# Patient Record
Sex: Male | Born: 1990 | Race: White | Hispanic: No | Marital: Single | Smoking: Never smoker
Health system: Southern US, Community
[De-identification: ages and names within clinical notes are randomized; demographics above are authoritative.]

---

## 2012-09-24 HISTORY — PX: ANKLE SURGERY: SHX546

## 2020-07-27 ENCOUNTER — Encounter (HOSPITAL_COMMUNITY): Payer: Self-pay | Admitting: Emergency Medicine

## 2020-07-27 ENCOUNTER — Emergency Department (HOSPITAL_COMMUNITY)
Admission: EM | Admit: 2020-07-27 | Discharge: 2020-07-27 | Disposition: A | Discharge status: Home / Self Care | Payer: Medicaid Other | Attending: Emergency Medicine | Admitting: Emergency Medicine

## 2020-07-27 ENCOUNTER — Other Ambulatory Visit: Payer: Self-pay

## 2020-07-27 DIAGNOSIS — X500XXA Overexertion from strenuous movement or load, initial encounter: Secondary | ICD-10-CM | POA: Diagnosis not present

## 2020-07-27 DIAGNOSIS — M5440 Lumbago with sciatica, unspecified side: Secondary | ICD-10-CM | POA: Insufficient documentation

## 2020-07-27 DIAGNOSIS — Y93F2 Activity, caregiving, lifting: Secondary | ICD-10-CM | POA: Diagnosis not present

## 2020-07-27 DIAGNOSIS — M545 Low back pain, unspecified: Secondary | ICD-10-CM | POA: Diagnosis present

## 2020-07-27 MED ORDER — PREDNISONE 10 MG PO TABS: 20.0000 mg | ORAL_TABLET | Freq: Every day | ORAL | 0 refills | Status: DC

## 2020-07-27 MED ORDER — METHYLPREDNISOLONE SODIUM SUCC 125 MG IJ SOLR
125.0000 mg | Freq: Once | INTRAMUSCULAR | Status: AC
  Administered 2020-07-27: 125 mg via INTRAMUSCULAR
  Filled 2020-07-27: qty 2

## 2020-07-27 NOTE — Discharge Instructions (Signed)
Follow-up with Dr. Romeo Apple next week.  Make sure you take the tramadol 4 times a day for discomfort.

## 2020-07-27 NOTE — ED Provider Notes (Signed)
[facility] EMERGENCY DEPARTMENT Provider Note   CSN: [id number] Arrival date & time: 07/27/20  1653     History Chief Complaint  Patient presents with  . Back Pain    Nathaniel Guerrero is a 29 y.o. male.  Patient complains of lower back pain with pain radiating down both legs.  Patient recently was doing heavy lifting and had back pain.  He was seen in another hospital 2 days ago and had x-rays that showed degenerative changes and was started on tramadol.  Patient has only taken tramadol twice a day for the discomfort.  The history is provided by the patient and medical records. No language interpreter was used.  Back Pain Location:  Lumbar spine Quality:  Aching Radiates to: Both legs. Pain severity:  Moderate Pain is:  Worse during the day Onset quality:  Gradual Timing:  Constant Progression:  Worsening Chronicity:  New Context: not emotional stress   Associated symptoms: no abdominal pain, no chest pain and no headaches        History reviewed. No pertinent past medical history.  There are no problems to display for this patient.   Past Surgical History:  Procedure Laterality Date  . ANKLE SURGERY Right 2014       History reviewed. No pertinent family history.  Social History   Tobacco Use  . Smoking status: Never Smoker  . Smokeless tobacco: Never Used  Substance Use Topics  . Alcohol use: Never  . Drug use: Never    Home Medications Prior to Admission medications   Medication Sig Start Date End Date Taking? Authorizing Provider  predniSONE (DELTASONE) 10 MG tablet Take 2 tablets (20 mg total) by mouth daily. 07/27/20   Bethann Berkshire, MD    Allergies    Toradol [ketorolac tromethamine]  Review of Systems   Review of Systems  Constitutional: Negative for appetite change and fatigue.  HENT: Negative for congestion, ear discharge and sinus pressure.   Eyes: Negative for discharge.  Respiratory: Negative for cough.   Cardiovascular: Negative  for chest pain.  Gastrointestinal: Negative for abdominal pain and diarrhea.  Genitourinary: Negative for frequency and hematuria.  Musculoskeletal: Positive for back pain.  Skin: Negative for rash.  Neurological: Negative for seizures and headaches.  Psychiatric/Behavioral: Negative for hallucinations.    Physical Exam Updated Vital Signs BP 130/83 (BP Location: Right Arm)   Pulse 80   Temp 98.8 F (37.1 C) (Oral)   Resp 18   Ht 6\' 3"  (1.905 m)   Wt 102.1 kg   SpO2 97%   BMI 28.12 kg/m   Physical Exam Vitals reviewed.  Constitutional:      Appearance: He is well-developed.  HENT:     Head: Normocephalic.     Nose: Nose normal.  Eyes:     General: No scleral icterus.    Conjunctiva/sclera: Conjunctivae normal.  Neck:     Thyroid: No thyromegaly.  Cardiovascular:     Rate and Rhythm: Normal rate and regular rhythm.     Heart sounds: No murmur heard.  No friction rub. No gallop.   Pulmonary:     Breath sounds: No stridor. No wheezing or rales.  Chest:     Chest wall: No tenderness.  Abdominal:     General: There is no distension.     Tenderness: There is no abdominal tenderness. There is no rebound.  Musculoskeletal:     Cervical back: Neck supple.     Comments: Tender lumbar spine with positive straight leg bilaterally  Lymphadenopathy:     Cervical: No cervical adenopathy.  Skin:    Findings: No erythema or rash.  Neurological:     Mental Status: He is alert and oriented to person, place, and time.     Motor: No abnormal muscle tone.     Coordination: Coordination normal.  Psychiatric:        Behavior: Behavior normal.     ED Results / Procedures / Treatments   Labs (all labs ordered are listed, but only abnormal results are displayed) Labs Reviewed - No data to display  EKG None  Radiology No results found.  Procedures Procedures (including critical care time)  Medications Ordered in ED Medications  methylPREDNISolone sodium succinate  (SOLU-MEDROL) 125 mg/2 mL injection 125 mg (has no administration in time range)    ED Course  I have reviewed the triage vital signs and the nursing notes.  Pertinent labs & imaging results that were available during my care of the patient were reviewed by me and considered in my medical decision making (see chart for details).    MDM Rules/Calculators/A&P                          Patient with lumbar back pain.  Degenerative changes seen on x-ray.  We will start the patient on prednisone and have him increase the tramadol to 4 times a day and he is referred to orthopedics     This patient presents to the ED for concern of back pain, this involves an extensive number of treatment options, and is a complaint that carries with it a high risk of complications and morbidity.  The differential diagnosis includes lumbar pain sciatica   Lab Tests:     Medicines ordered:   I ordered medication Solu-Medrol  Imaging Studies ordered:     Additional history obtained:     Consultations Obtained:     Reevaluation:  After the interventions stated above, I reevaluated the patient and found no change  Critical Interventions:  .   Final Clinical Impression(s) / ED Diagnoses Final diagnoses:  Acute left-sided low back pain with sciatica, sciatica laterality unspecified    Rx / DC Orders ED Discharge Orders         Ordered    predniSONE (DELTASONE) 10 MG tablet  Daily        07/27/20 1731           Bethann Berkshire, MD 07/27/20 1735

## 2020-07-27 NOTE — ED Triage Notes (Addendum)
Pt reports was seen for same on Monday at [facility]. Pt reports was diagnosed with degenerative disc disease. Pt reports was moving furniture this weekend and reports lower back pain ever since. Pt denies any gi/gu symptoms. Pt reports pain is radiating to BLE and is getting harder to bear weight/ambulate. Pt reports given tramadol for pain and reports temporarily relieves back pain.

## 2021-04-26 ENCOUNTER — Observation Stay (HOSPITAL_COMMUNITY)
Admission: EM | Admit: 2021-04-26 | Discharge: 2021-04-29 | Disposition: A | Discharge status: Home / Self Care | Payer: Medicaid Other | Attending: Internal Medicine | Admitting: Internal Medicine

## 2021-04-26 ENCOUNTER — Encounter (HOSPITAL_COMMUNITY): Payer: Self-pay | Admitting: *Deleted

## 2021-04-26 ENCOUNTER — Other Ambulatory Visit: Payer: Self-pay

## 2021-04-26 ENCOUNTER — Emergency Department (HOSPITAL_COMMUNITY): Payer: Medicaid Other

## 2021-04-26 DIAGNOSIS — R200 Anesthesia of skin: Secondary | ICD-10-CM | POA: Insufficient documentation

## 2021-04-26 DIAGNOSIS — R519 Headache, unspecified: Secondary | ICD-10-CM | POA: Diagnosis not present

## 2021-04-26 DIAGNOSIS — R479 Unspecified speech disturbances: Secondary | ICD-10-CM | POA: Diagnosis not present

## 2021-04-26 DIAGNOSIS — W08XXXA Fall from other furniture, initial encounter: Secondary | ICD-10-CM | POA: Diagnosis not present

## 2021-04-26 DIAGNOSIS — Y92009 Unspecified place in unspecified non-institutional (private) residence as the place of occurrence of the external cause: Secondary | ICD-10-CM | POA: Diagnosis not present

## 2021-04-26 DIAGNOSIS — Z20822 Contact with and (suspected) exposure to covid-19: Secondary | ICD-10-CM | POA: Insufficient documentation

## 2021-04-26 DIAGNOSIS — R531 Weakness: Principal | ICD-10-CM

## 2021-04-26 IMAGING — CT CT HEAD W/O CM
3 series · 16 of 47 positions shown, 19 images · non-contrast
Comparison: MRI brain [DATE] and prior head CT [DATE]

CLINICAL DATA: History of fall 2 nights ago.  Left-sided weakness.

EXAM:
CT HEAD WITHOUT CONTRAST
TECHNIQUE: Contiguous axial images were obtained from the base of the skull
through the vertex without intravenous contrast.

[Series 2: head w o · axial · 0.48mm/px · z∈[+16,+156]mm · 10 of 34 slices shown, 13 images]
[im 3/34  brain]
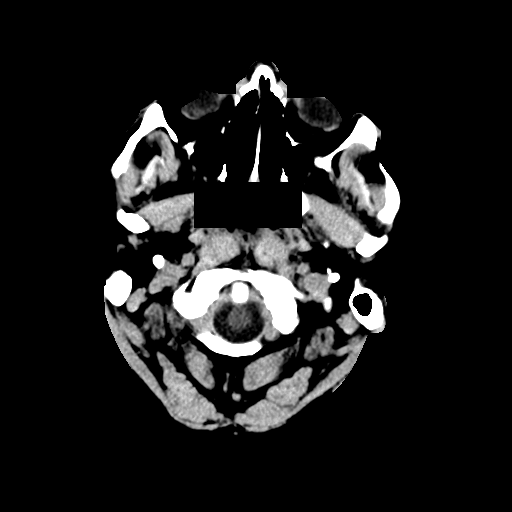
[im 3/34  bone]
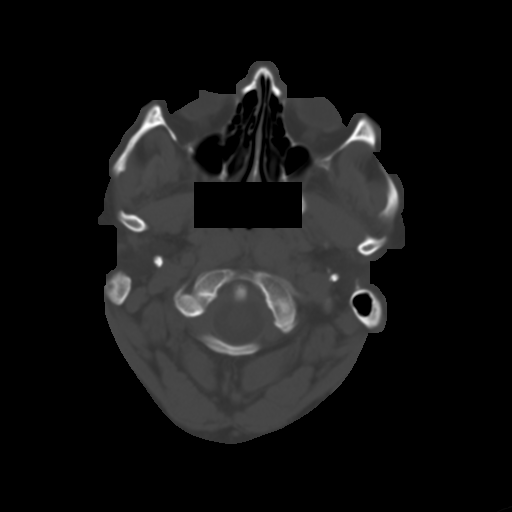
[im 6/34  brain]
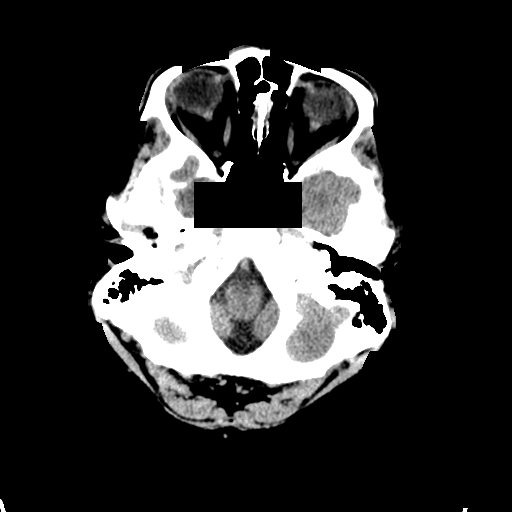
[im 10/34  brain]
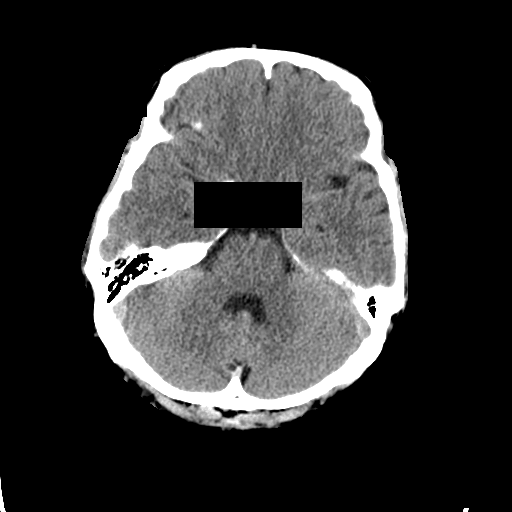
[im 12/34  brain]
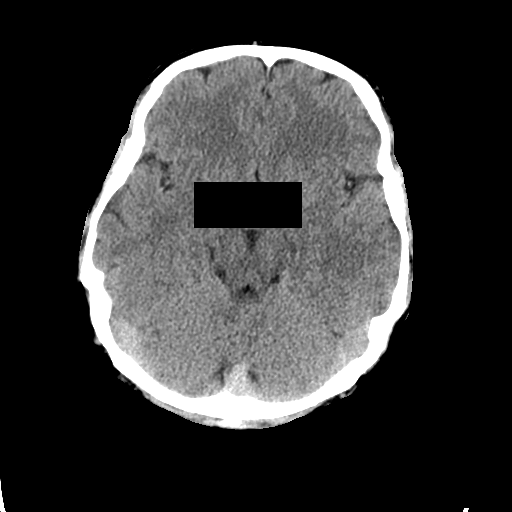
[im 15/34  brain]
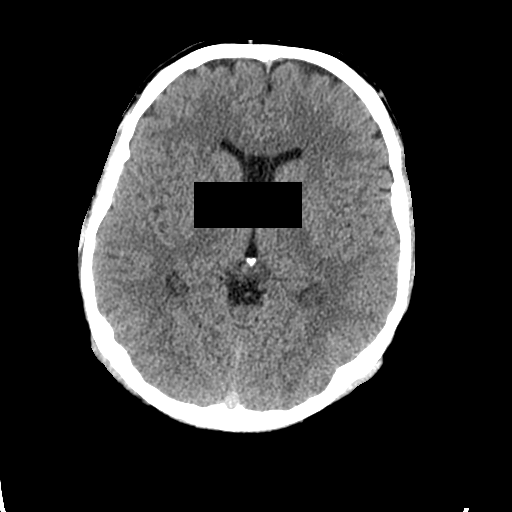
[im 15/34  bone]
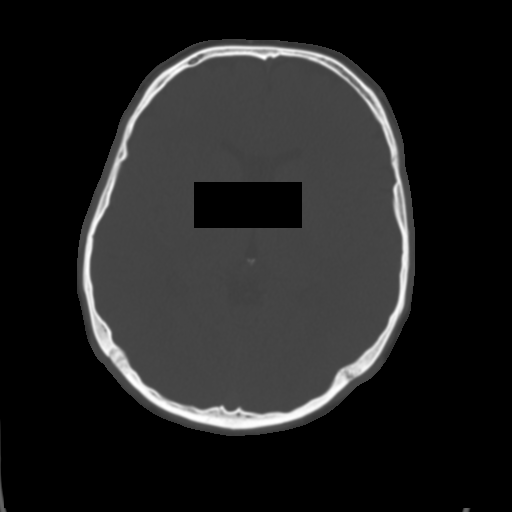
[im 19/34  brain]
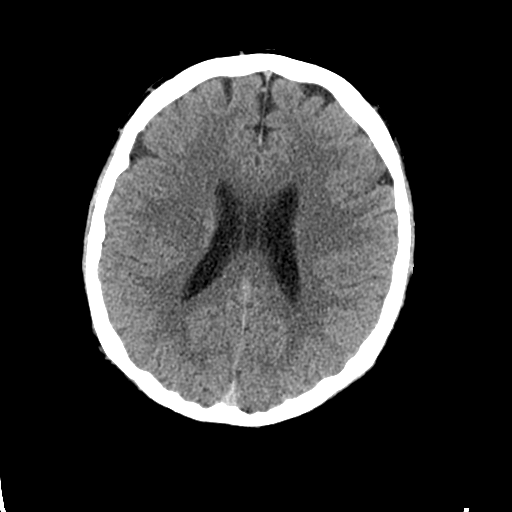
[im 22/34  brain]
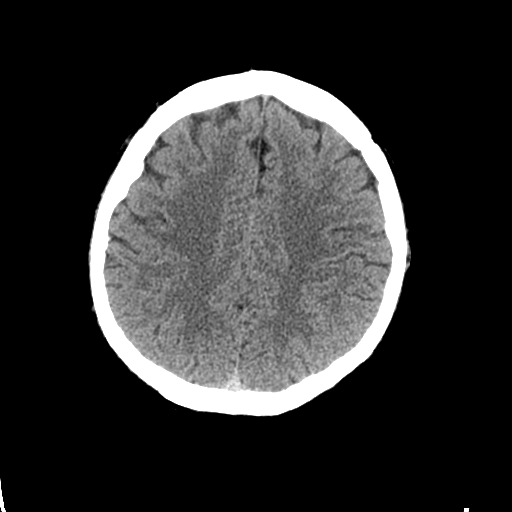
[im 26/34  brain]
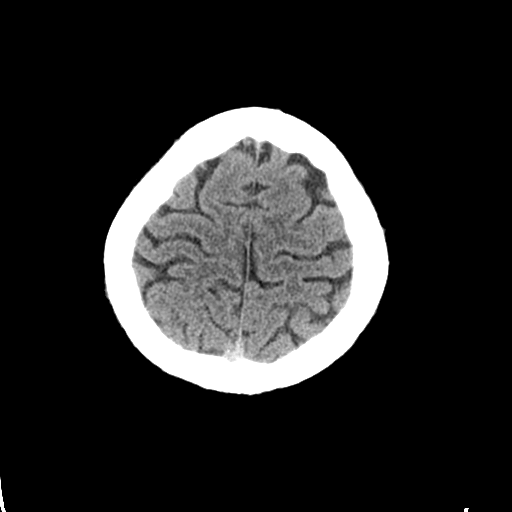
[im 28/34  brain]
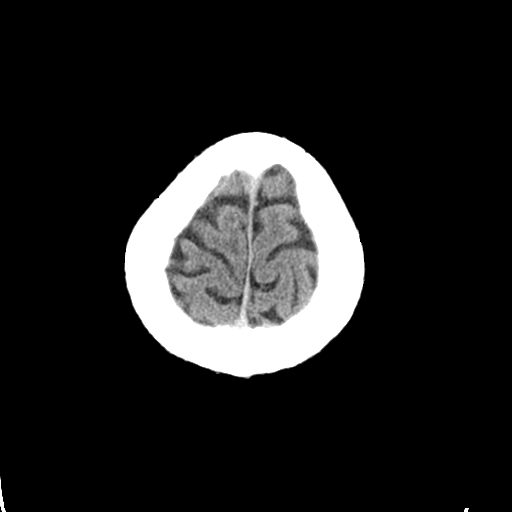
[im 28/34  bone]
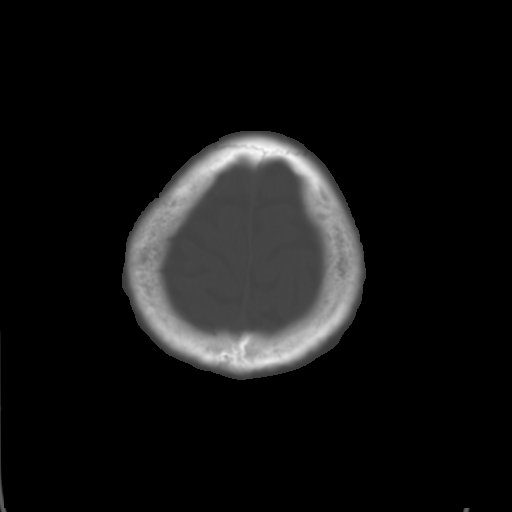
[im 31/34  brain]
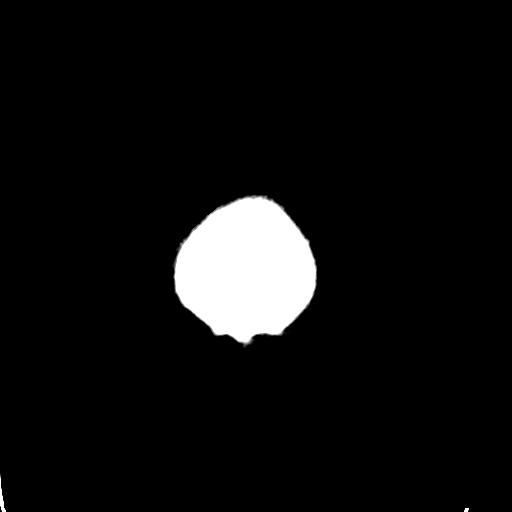

[Series 4: coronal soft · coronal · 0.35mm/px · 3 of 70 slices shown]
[im 24/70  brain]
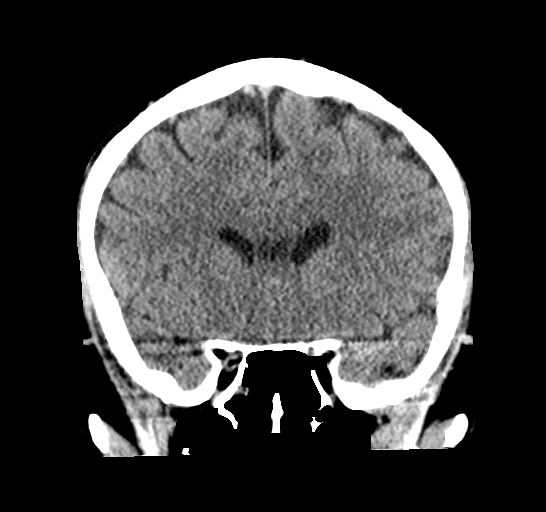
[im 31/70  brain]
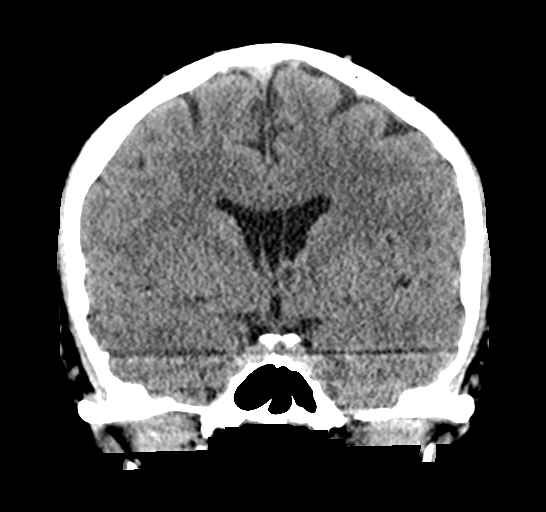
[im 39/70  brain]
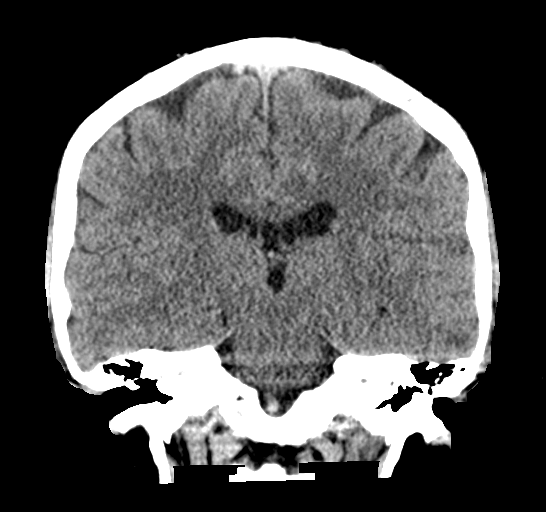

[Series 5: sagittal soft · sagittal · 0.39mm/px · 3 of 61 slices shown]
[im 21/61  brain]
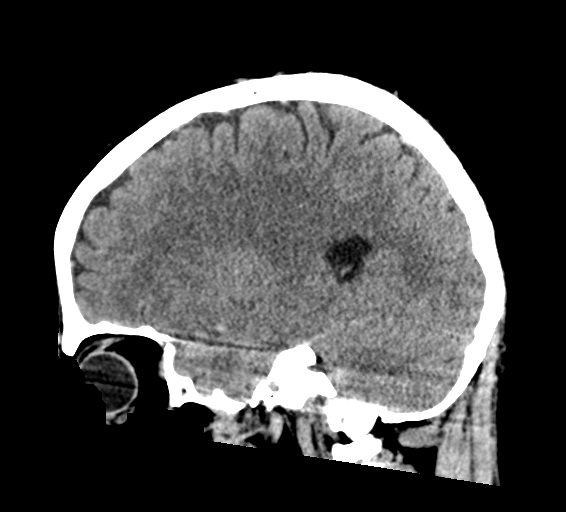
[im 31/61  brain]
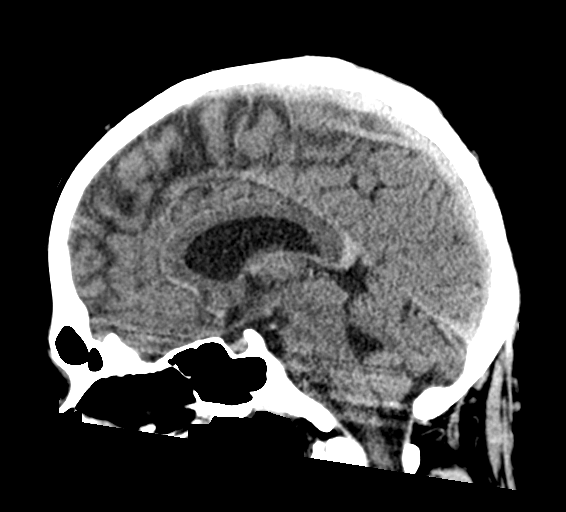
[im 41/61  brain]
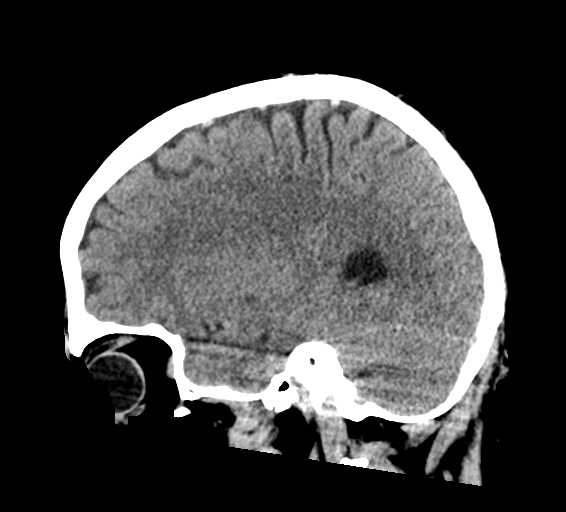

[16 of 47 positions shown; findings below may reference images not displayed]

FINDINGS: Brain: The ventricles are in the midline without mass effect or
shift. Incidental cavum septum pellucidum again noted. No
extra-axial fluid collections are identified. The gray-white
differentiation is maintained. No acute intracranial findings or
mass lesions. Brainstem and cerebellum are unremarkable.

Vascular: No vascular calcifications or hyperdense vessels.

Skull: No skull fracture or bone lesions.

Sinuses/Orbits: Paranasal sinuses and mastoid air cells are clear.
The globes are intact.

Other: No scalp lesions or scalp hematoma.
IMPRESSION: Normal and stable head CT.

## 2021-04-26 NOTE — ED Provider Notes (Signed)
[facility] EMERGENCY DEPARTMENT Provider Note   CSN: [id number] Arrival date & time: 04/26/21  1929     History Chief Complaint  Patient presents with   Extremity Weakness    Nathaniel Guerrero is a 30 y.o. male.  Patient is a 30 year old male presenting with complaints of weakness in his left arm and left leg, speech difficulty, and follow-up.  Patient initially seen at [facility] 3 days ago for similar issues.  Currently underwent CT of the head, CTA of the head, MRI of the brain, and neurology consult.  All of his studies were unremarkable.  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  Patient was discharged from there yesterday.  He presents here today complaining of his symptoms worsening and having difficulty with ambulation.  He states that he needs a cane to ambulate and his "speech is getting worse".  Upon reviewing his record.  Patient was seen 3 times at [facility] and a 2-day..  The first visit was related to ankle pain he has had since childhood.  The second visit was related to chest pain during which time he eloped prior to cycling of his cardiac enzymes.  The third visit was for the weakness of the left arm and leg.  The history is provided by the patient.  Extremity Weakness This is a new problem. The current episode started 2 days ago. The problem occurs constantly. Nothing aggravates the symptoms. Nothing relieves the symptoms. He has tried nothing for the symptoms.      History reviewed. No pertinent past medical history.  There are no problems to display for this patient.   Past Surgical History:  Procedure Laterality Date   ANKLE SURGERY Right 2014       History reviewed. No pertinent family history.  Social History   Tobacco Use   Smoking status: Never   Smokeless tobacco: Never  Substance Use Topics   Alcohol use: Never   Drug use: Never    Home Medications Prior to Admission medications   Medication Sig Start Date End Date Taking?  Authorizing Provider  predniSONE (DELTASONE) 10 MG tablet Take 2 tablets (20 mg total) by mouth daily. 07/27/20   Bethann Berkshire, MD    Allergies    Toradol [ketorolac tromethamine]  Review of Systems   Review of Systems  Musculoskeletal:  Positive for extremity weakness.  All other systems reviewed and are negative.  Physical Exam Updated Vital Signs BP 130/67   Pulse 81   Temp 98.7 F (37.1 C) (Oral)   Resp 20   Ht 6\' 3"  (1.905 m)   Wt 108.9 kg   SpO2 98%   BMI 30.00 kg/m   Physical Exam Vitals and nursing note reviewed.  Constitutional:      General: He is not in acute distress.    Appearance: He is well-developed. He is not diaphoretic.  HENT:     Head: Normocephalic and atraumatic.  Eyes:     Extraocular Movements: Extraocular movements intact.     Pupils: Pupils are equal, round, and reactive to light.  Cardiovascular:     Rate and Rhythm: Normal rate and regular rhythm.     Heart sounds: No murmur heard.   No friction rub.  Pulmonary:     Effort: Pulmonary effort is normal. No respiratory distress.     Breath sounds: Normal breath sounds. No wheezing or rales.  Abdominal:     General: Bowel sounds are normal. There is no distension.     Palpations:  Abdomen is soft.     Tenderness: There is no abdominal tenderness.  Musculoskeletal:        General: Normal range of motion.     Cervical back: Normal range of motion and neck supple.  Skin:    General: Skin is warm and dry.  Neurological:     Mental Status: He is alert and oriented to person, place, and time.     Coordination: Coordination normal.     Comments: Cranial nerves II through XII are grossly intact with no focal deficits.  His left hand is clenched and has difficulty both opening his hand and with hand grasp.  Plantar flexion and extension are 4 out of 5 in the left extremity and 5 out of 5 in the right.    ED Results / Procedures / Treatments   Labs (all labs ordered are listed, but only  abnormal results are displayed) Labs Reviewed - No data to display  EKG None  Radiology CT HEAD WO CONTRAST ( )  Result Date: 04/26/2021 CLINICAL DATA:  History of fall 2 nights ago.  Left-sided weakness. EXAM: CT HEAD WITHOUT CONTRAST TECHNIQUE: Contiguous axial images were obtained from the base of the skull through the vertex without intravenous contrast. COMPARISON:  MRI brain 04/26/2019 and prior head CT 04/24/2021 FINDINGS: Brain: The ventricles are in the midline without mass effect or shift. Incidental cavum septum pellucidum again noted. No extra-axial fluid collections are identified. The gray-white differentiation is maintained. No acute intracranial findings or mass lesions. Brainstem and cerebellum are unremarkable. Vascular: No vascular calcifications or hyperdense vessels. Skull: No skull fracture or bone lesions. Sinuses/Orbits: Paranasal sinuses and mastoid air cells are clear. The globes are intact. Other: No scalp lesions or scalp hematoma. IMPRESSION: Normal and stable head CT. Electronically Signed   By: Rudie Meyer M.D.   On: 04/26/2021 20:29    Procedures Procedures   Medications Ordered in ED Medications - No data to display  ED Course  I have reviewed the triage vital signs and the nursing notes.  Pertinent labs & imaging results that were available during my care of the patient were reviewed by me and considered in my medical decision making (see chart for details).    MDM Rules/Calculators/A&P  Patient presenting here with left-sided weakness and numbness as well as speech deficit as described in the HPI.  Patient previously worked up at [facility] with similar complaints.  Studies all unremarkable and patient was discharged.  He returns today stating that his symptoms are worse and believes that he is having a stroke.  His head CT is unremarkable and laboratory studies unremarkable.  I have spoken with teleneurology who has consulted and made recommendations  that patient be admitted for MRI with and without contrast and in person neurology consultation tomorrow.  I have spoken with Dr. Robb Matar who agrees to admit.  Final Clinical Impression(s) / ED Diagnoses Final diagnoses:  None    Rx / DC Orders ED Discharge Orders     None        Geoffery Lyons, MD 04/27/21 [phone]

## 2021-04-26 NOTE — ED Triage Notes (Signed)
Pt with fell Monday night due to sudden left sided weakness. Seen [facility] Monday night and stayed there over night and was released yesterday.  Pt states his speech has gotten worse.

## 2021-04-27 ENCOUNTER — Encounter (HOSPITAL_COMMUNITY): Payer: Self-pay | Admitting: Internal Medicine

## 2021-04-27 ENCOUNTER — Observation Stay (HOSPITAL_COMMUNITY): Payer: Medicaid Other

## 2021-04-27 DIAGNOSIS — R531 Weakness: Secondary | ICD-10-CM

## 2021-04-27 LAB — CBC WITH DIFFERENTIAL/PLATELET
Abs Immature Granulocytes: 0.02 10*3/uL (ref 0.00–0.07)
Basophils Absolute: 0 10*3/uL (ref 0.0–0.1)
Basophils Relative: 0 %
Eosinophils Absolute: 0 10*3/uL (ref 0.0–0.5)
Eosinophils Relative: 0 %
HCT: 50.2 % (ref 39.0–52.0)
Hemoglobin: 16.7 g/dL (ref 13.0–17.0)
Immature Granulocytes: 0 %
Lymphocytes Relative: 19 %
Lymphs Abs: 1.4 10*3/uL (ref 0.7–4.0)
MCH: 31.4 pg (ref 26.0–34.0)
MCHC: 33.3 g/dL (ref 30.0–36.0)
MCV: 94.4 fL (ref 80.0–100.0)
Monocytes Absolute: 0.5 10*3/uL (ref 0.1–1.0)
Monocytes Relative: 6 %
Neutro Abs: 5.3 10*3/uL (ref 1.7–7.7)
Neutrophils Relative %: 75 %
Platelets: 272 10*3/uL (ref 150–400)
RBC: 5.32 MIL/uL (ref 4.22–5.81)
RDW: 12.7 % (ref 11.5–15.5)
WBC: 7.1 10*3/uL (ref 4.0–10.5)
nRBC: 0 % (ref 0.0–0.2)

## 2021-04-27 LAB — RESP PANEL BY RT-PCR (FLU A&B, COVID) ARPGX2
Influenza A by PCR: NEGATIVE
Influenza B by PCR: NEGATIVE
SARS Coronavirus 2 by RT PCR: NEGATIVE

## 2021-04-27 LAB — VITAMIN B12: Vitamin B-12: 245 pg/mL (ref 180–914)

## 2021-04-27 LAB — BASIC METABOLIC PANEL
Anion gap: 8 (ref 5–15)
BUN: 17 mg/dL (ref 6–20)
CO2: 27 mmol/L (ref 22–32)
Calcium: 9.4 mg/dL (ref 8.9–10.3)
Chloride: 102 mmol/L (ref 98–111)
Creatinine, Ser: 1.01 mg/dL (ref 0.61–1.24)
GFR, Estimated: >60 mL/min (ref >60)
Glucose, Bld: 91 mg/dL (ref 70–99)
Potassium: 3.7 mmol/L (ref 3.5–5.1)
Sodium: 137 mmol/L (ref 135–145)

## 2021-04-27 LAB — TSH: TSH: 2.975 u[IU]/mL (ref 0.350–4.500)

## 2021-04-27 LAB — PROTIME-INR
INR: 1 (ref 0.8–1.2)
Prothrombin Time: 13.1 seconds (ref 11.4–15.2)

## 2021-04-27 IMAGING — MR MR HEAD WO/W CM
13 of 15 series · 36 of 48 positions shown · IV contrast (gadavist)
Comparison: MRI head [DATE]

CLINICAL DATA: Acute neuro deficit with left-sided weakness 3 days

EXAM:
MRI HEAD WITHOUT AND WITH CONTRAST
TECHNIQUE: Multiplanar, multiecho pulse sequences of the brain and surrounding
structures were obtained without and with intravenous contrast.
CONTRAST:  10mL GADAVIST GADOBUTROL 1 MMOL/ML IV SOLN

[Series 5: DWI · axial · 4.0mm · 0.88mm/px · z∈[-68,+72]mm · 4 of 36 slices shown (1 of 6)]
[im 1/36]
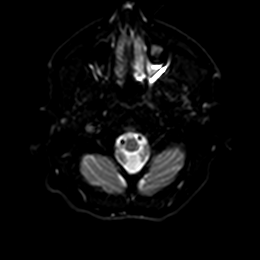
[im 12/36]
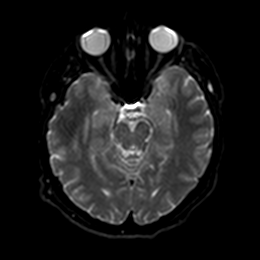
[im 24/36]
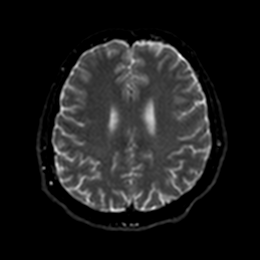
[im 36/36]
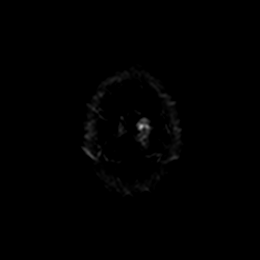

[Series 5: DWI · axial · 4.0mm · 0.88mm/px · z∈[-68,+72]mm · 3 of 36 slices shown (2 of 6)]
[im 1/36]
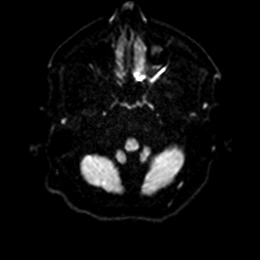
[im 18/36]
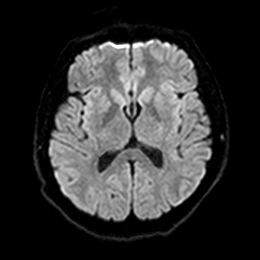
[im 36/36]
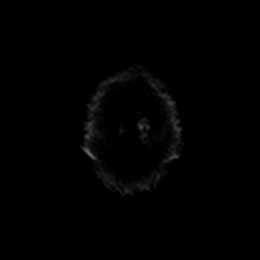

[Series 6: DWI · axial · 4.0mm · 0.88mm/px · z∈[-68,+72]mm · 3 of 36 slices shown (3 of 6)]
[im 1/36]
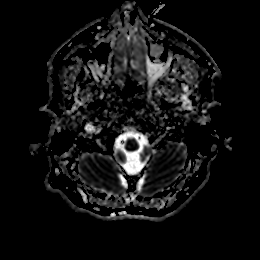
[im 18/36]
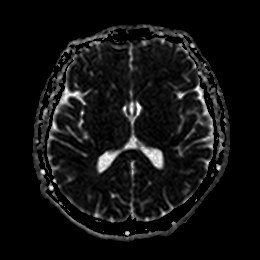
[im 36/36]
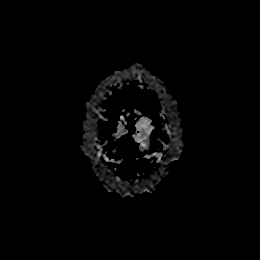

[Series 7: DWI · coronal · 5.0mm · 0.88mm/px · 3 of 28 slices shown (4 of 6)]
[im 1/28]
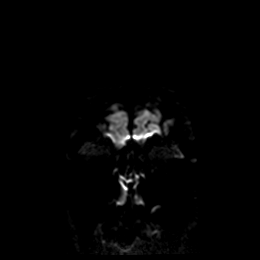
[im 14/28]
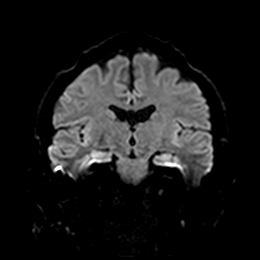
[im 28/28]
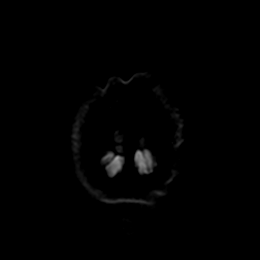

[Series 7: DWI · coronal · 5.0mm · 0.88mm/px · 3 of 28 slices shown (5 of 6)]
[im 1/28]
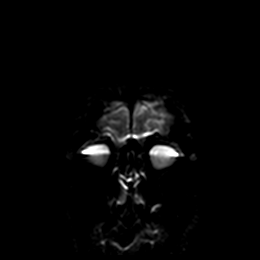
[im 14/28]
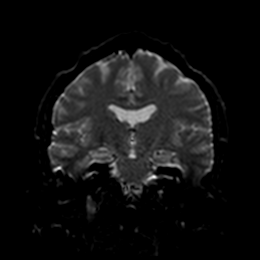
[im 28/28]
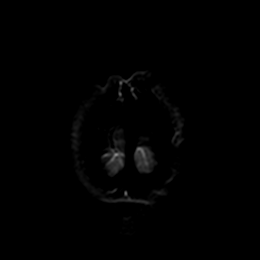

[Series 8: DWI · coronal · 5.0mm · 0.88mm/px · 3 of 28 slices shown (6 of 6)]
[im 1/28]
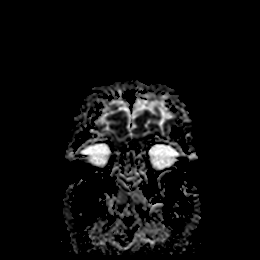
[im 14/28]
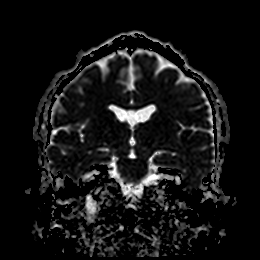
[im 28/28]
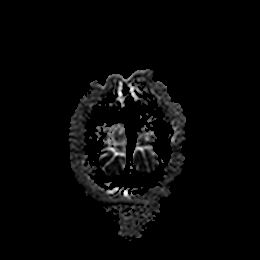

[Series 9: T1 · sagittal · 5.0mm · 0.94mm/px · 2 of 25 slices shown (1 of 2)]
[im 1/25]
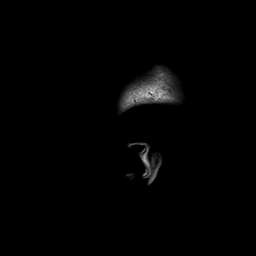
[im 25/25]
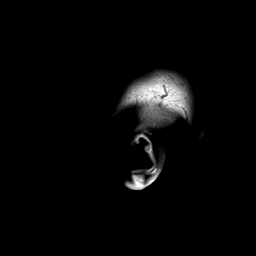

[Series 10: T2 · axial · 5.0mm · 0.72mm/px · z∈[-65,+68]mm · 2 of 20 slices shown (1 of 2)]
[im 1/20]
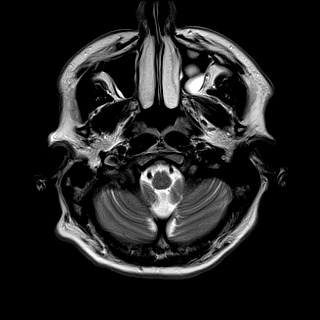
[im 20/20]
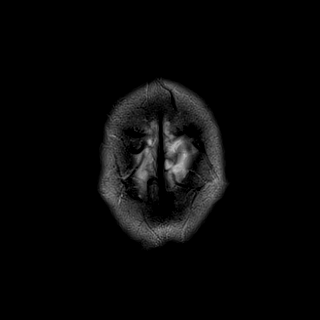

[Series 11: ax hemo · axial · 5.0mm · 0.86mm/px · z∈[-70,+74]mm · 2 of 25 slices shown]
[im 1/25]
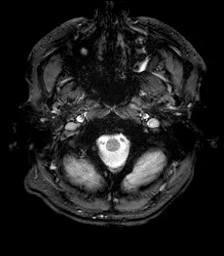
[im 25/25]
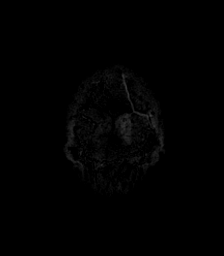

[Series 12: FLAIR · axial · 4.0mm · 0.43mm/px · z∈[-60,+64]mm · 3 of 32 slices shown]
[im 1/32]
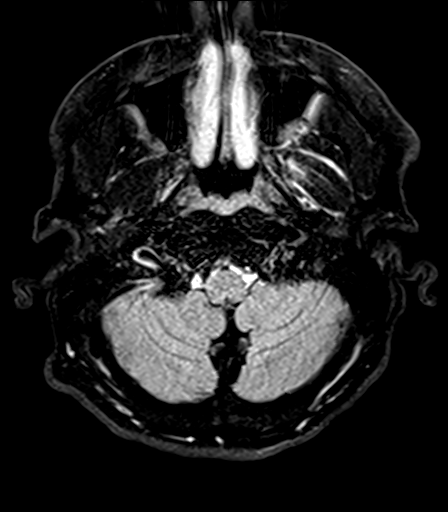
[im 16/32]
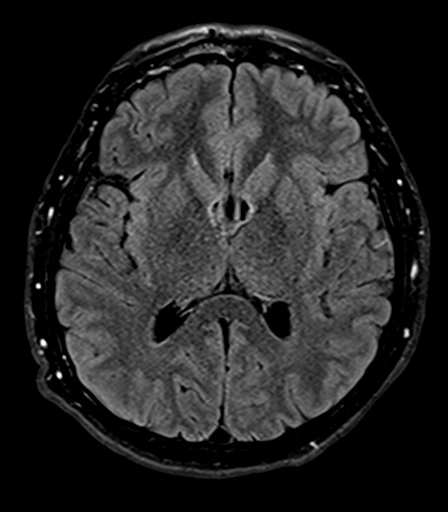
[im 32/32]
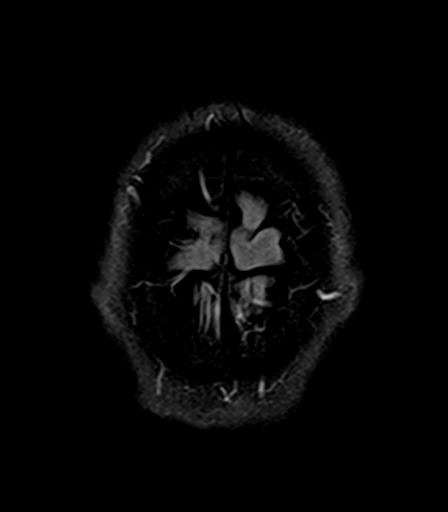

[Series 14: T2 · coronal · 5.0mm · 0.72mm/px · 3 of 28 slices shown (2 of 2)]
[im 1/28]
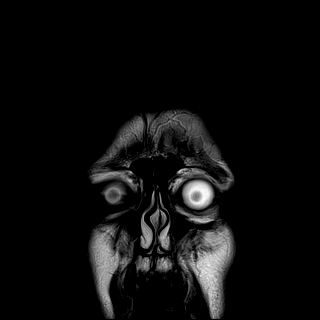
[im 14/28]
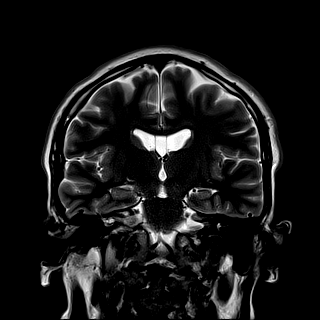
[im 28/28]
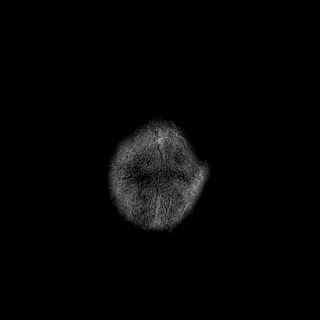

[Series 16: T1 post-contrast · coronal · 5.0mm · 0.34mm/px · 3 of 29 slices shown]
[im 1/29]
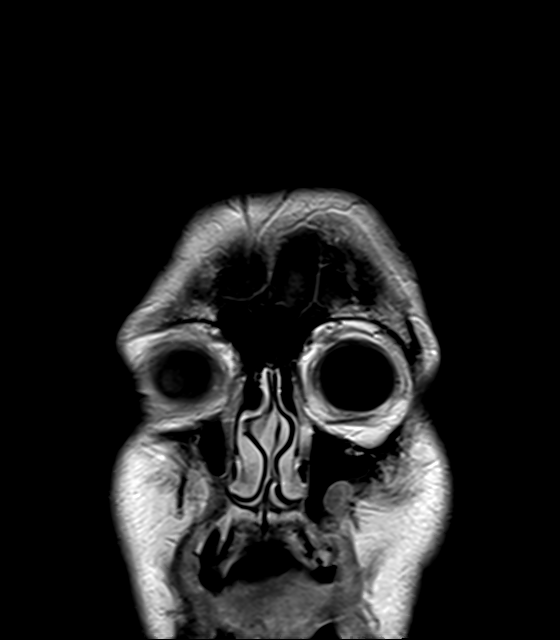
[im 15/29]
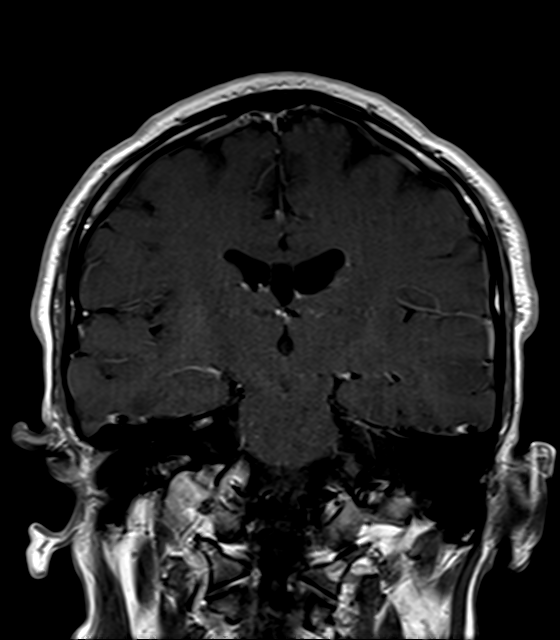
[im 29/29]
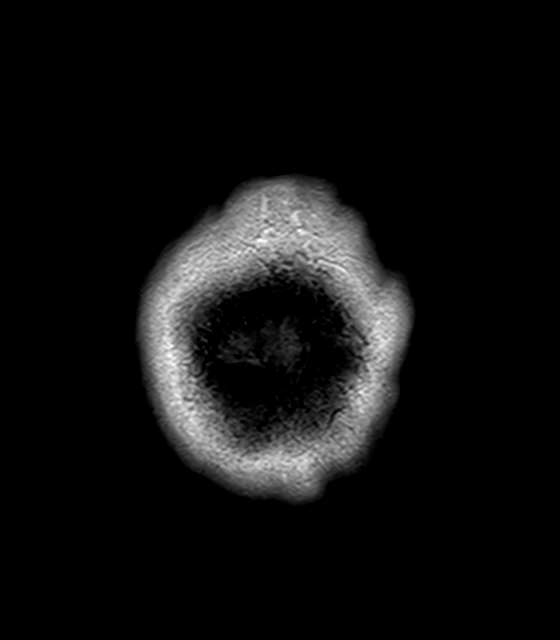

[Series 17: T1 · sagittal · 5.0mm · 0.94mm/px · 2 of 25 slices shown (2 of 2)]
[im 1/25]
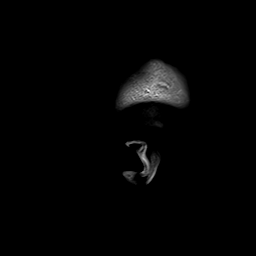
[im 25/25]
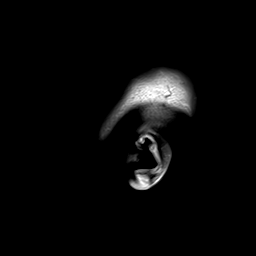

[36 of 48 positions shown; findings below may reference images not displayed]

FINDINGS: Brain: No acute infarction, hemorrhage, hydrocephalus, extra-axial
collection or mass lesion. Normal white matter. Normal enhancement.

Vascular: Normal arterial flow voids.

Skull and upper cervical spine: Negative

Sinuses/Orbits: Mucosal edema paranasal sinuses.  Negative orbit

Other: None
IMPRESSION: Normal MRI brain with contrast.

## 2021-04-27 IMAGING — MR MR CERVICAL SPINE W/O CM
5 series · 37 of 48 positions shown · non-contrast
Comparison: CT a neck [DATE]

CLINICAL DATA: Myelopathy, dizziness and neck pain

EXAM:
MRI CERVICAL SPINE WITHOUT CONTRAST
TECHNIQUE: Multiplanar, multisequence MR imaging of the cervical spine was
performed. No intravenous contrast was administered.

[Series 5: t2_tse_sag_fast · sagittal · 3.0mm · 0.43mm/px · 6 of 15 slices shown]
[im 1/15]
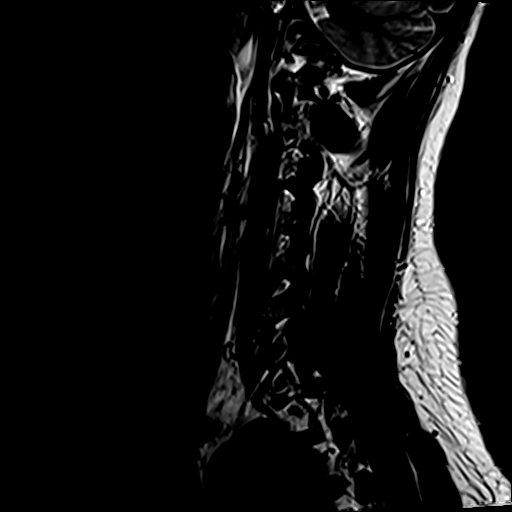
[im 3/15]
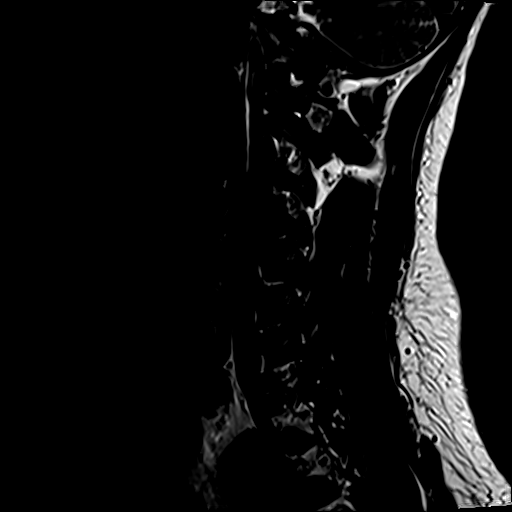
[im 6/15]
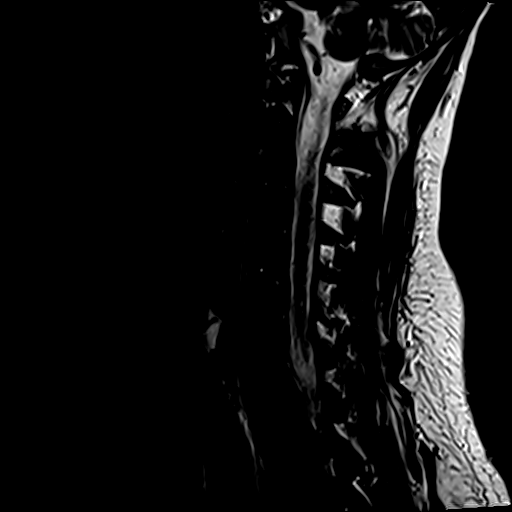
[im 9/15]
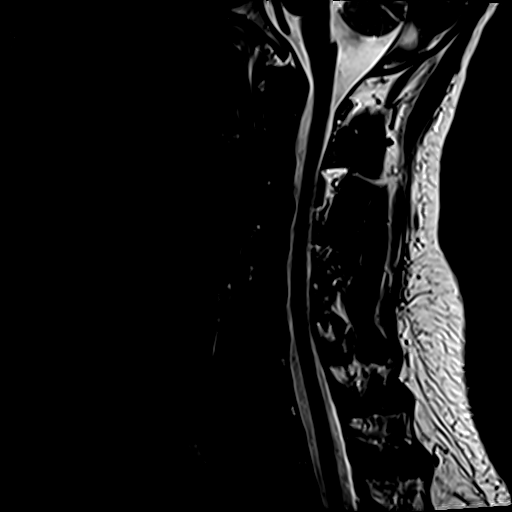
[im 12/15]
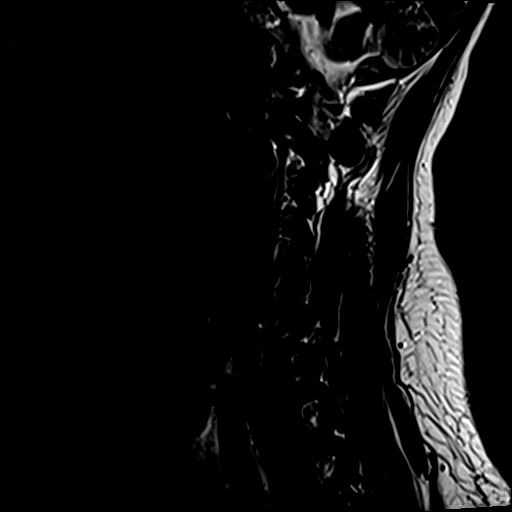
[im 15/15]
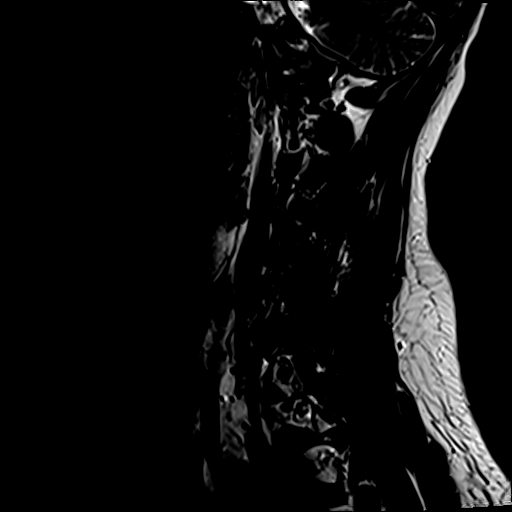

[Series 6: t1_tse_sag_fast · sagittal · 3.0mm · 0.43mm/px · 6 of 15 slices shown]
[im 1/15]
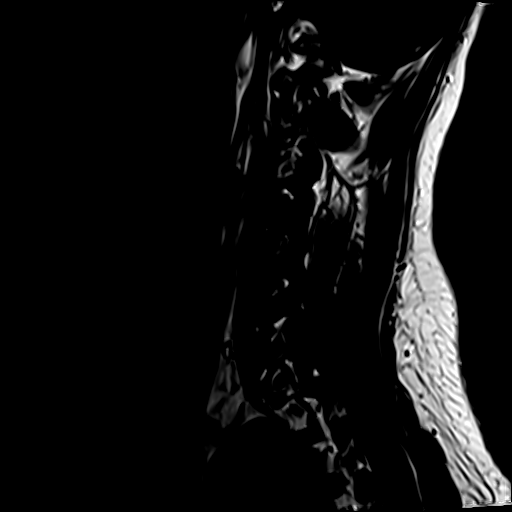
[im 3/15]
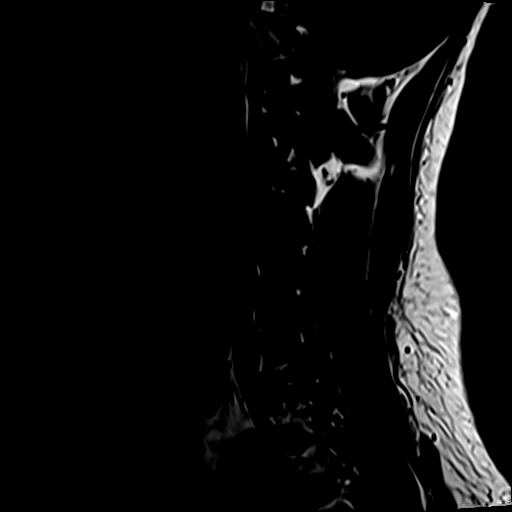
[im 6/15]
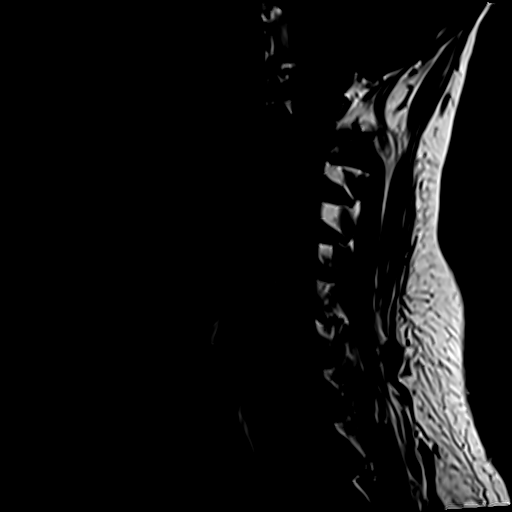
[im 9/15]
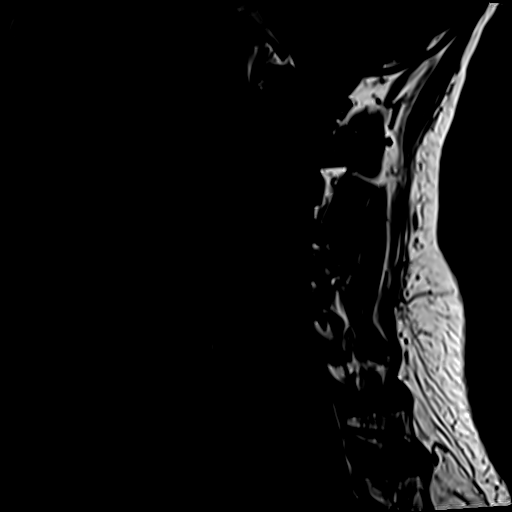
[im 12/15]
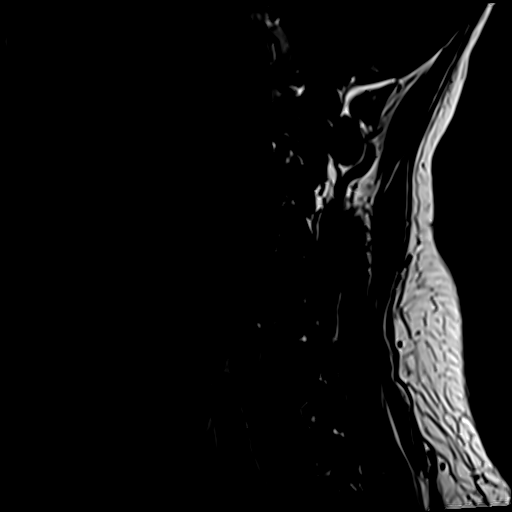
[im 15/15]
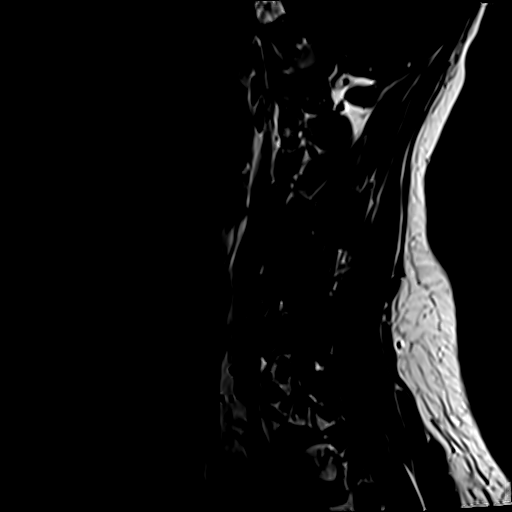

[Series 7: STIR · sagittal · 3.0mm · 0.86mm/px · 6 of 15 slices shown]
[im 1/15]
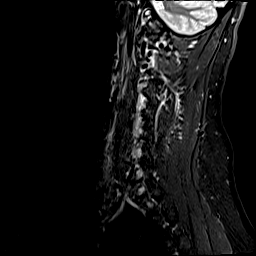
[im 3/15]
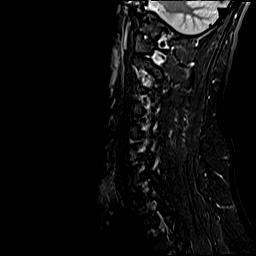
[im 6/15]
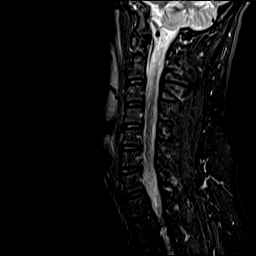
[im 9/15]
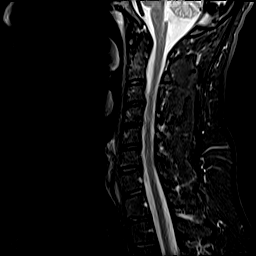
[im 12/15]
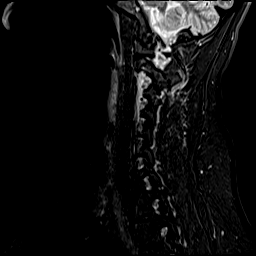
[im 15/15]
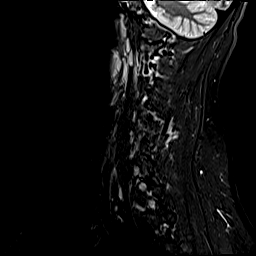

[Series 8: t2_tse_tra_fast · axial · 3.0mm · 0.78mm/px · z∈[-83,+44]mm · 11 of 40 slices shown]
[im 1/40]
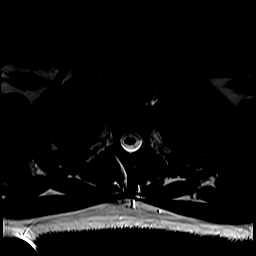
[im 3/40]
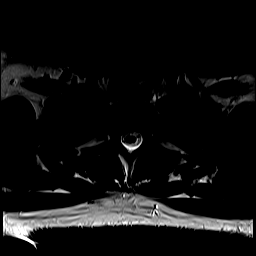
[im 6/40]
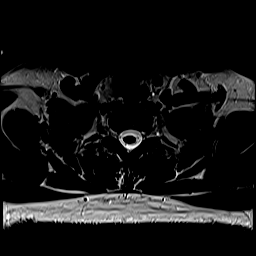
[im 9/40]
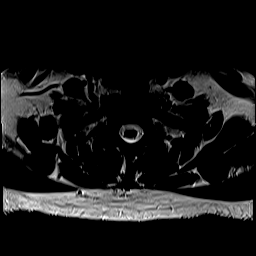
[im 12/40]
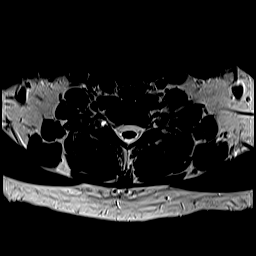
[im 17/40]
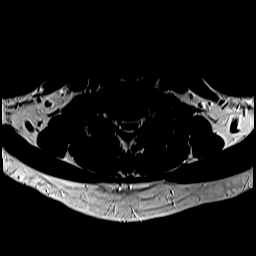
[im 20/40]
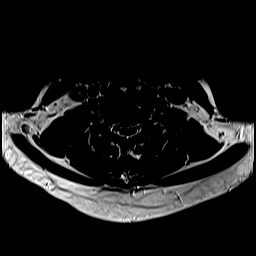
[im 23/40]
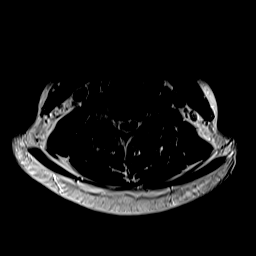
[im 28/40]
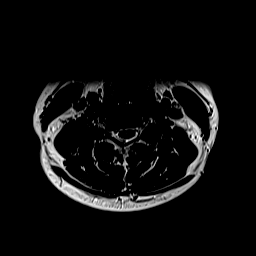
[im 34/40]
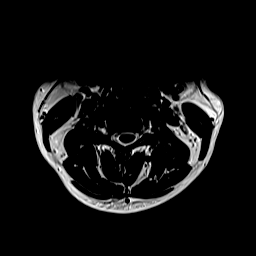
[im 40/40]
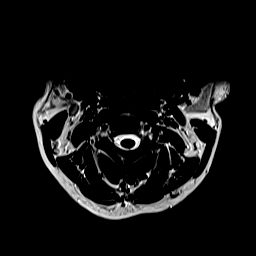

[Series 9: GRE · axial · 3.0mm · 0.78mm/px · z∈[-83,+44]mm · 8 of 40 slices shown]
[im 1/40]
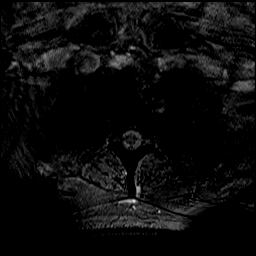
[im 6/40]
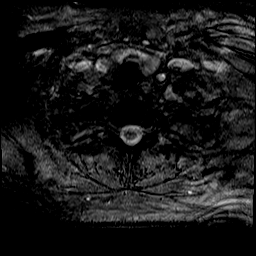
[im 12/40]
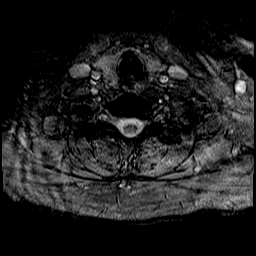
[im 17/40]
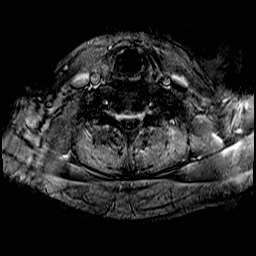
[im 23/40]
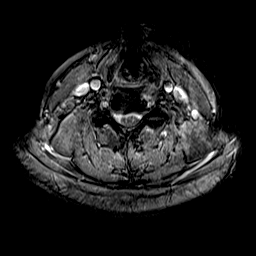
[im 28/40]
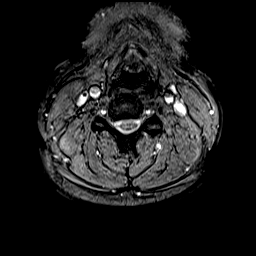
[im 34/40]
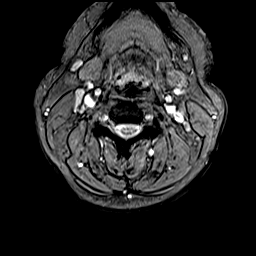
[im 40/40]
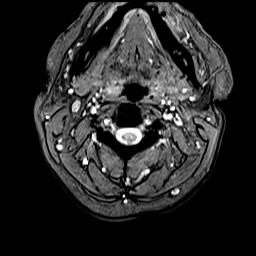

[37 of 48 positions shown; findings below may reference images not displayed]

FINDINGS: Alignment: Physiologic.

Vertebrae: Marrow signal is normal. Vertebral body heights are
preserved.

Cord: Normal signal and morphology.

Posterior Fossa, vertebral arteries, paraspinal tissues: Negative.

Disc levels:

The intervertebral disc spaces are preserved. There are minimal disc
protrusions at C5-C6 and C6-C7 without significant spinal canal or
neural foraminal stenosis. There is no significant spinal canal or
neural foraminal stenosis at the remaining levels.
IMPRESSION: Essentially normal cervical spine MRI with no finding to explain the
patient's symptoms.

## 2021-04-27 MED ORDER — ONDANSETRON HCL 4 MG PO TABS
4.0000 mg | ORAL_TABLET | Freq: Four times a day (QID) | ORAL | Status: DC | PRN
  Administered 2021-04-27: 4 mg via ORAL
  Filled 2021-04-27: qty 1

## 2021-04-27 MED ORDER — STROKE: EARLY STAGES OF RECOVERY BOOK
Freq: Once | Status: AC
  Filled 2021-04-27: qty 1

## 2021-04-27 MED ORDER — ONDANSETRON HCL 4 MG/2ML IJ SOLN
4.0000 mg | Freq: Four times a day (QID) | INTRAMUSCULAR | Status: DC | PRN
  Administered 2021-04-28: 4 mg via INTRAVENOUS
  Filled 2021-04-27: qty 2

## 2021-04-27 MED ORDER — ENOXAPARIN SODIUM 40 MG/0.4ML IJ SOSY
40.0000 mg | PREFILLED_SYRINGE | INTRAMUSCULAR | Status: DC
  Administered 2021-04-27: 40 mg via SUBCUTANEOUS
  Filled 2021-04-27: qty 0.4

## 2021-04-27 MED ORDER — ACETAMINOPHEN 650 MG RE SUPP: 650.0000 mg | Freq: Four times a day (QID) | RECTAL | Status: DC | PRN

## 2021-04-27 MED ORDER — GADOBUTROL 1 MMOL/ML IV SOLN
10.0000 mL | Freq: Once | INTRAVENOUS | Status: AC | PRN
  Administered 2021-04-27: 10 mL via INTRAVENOUS

## 2021-04-27 MED ORDER — ACETAMINOPHEN 325 MG PO TABS
650.0000 mg | ORAL_TABLET | Freq: Four times a day (QID) | ORAL | Status: DC | PRN
  Administered 2021-04-27 – 2021-04-28 (×2): 650 mg via ORAL
  Filled 2021-04-27 (×2): qty 2

## 2021-04-27 NOTE — ED Notes (Signed)
Pt being assessed by tele-neuro at this time

## 2021-04-27 NOTE — Progress Notes (Signed)
SLP Cancellation Note  Patient Details Name: Nathaniel Guerrero MRN: [id number] DOB: 13-Aug-1991   Cancelled treatment:       Reason Eval/Treat Not Completed: Patient at procedure or test/unavailable (Pt down for MRI, SLE requested. Unsure if [employer] administered)  Thank you,  Havery Moros, CCC-SLP [phone]  [name] 04/27/2021, 9:10 AM

## 2021-04-27 NOTE — Progress Notes (Signed)
Patient admitted to the hospital earlier this morning by Dr. Robb Matar  Patient seen and examined.  He continues to complain of left upper and lower extremity weakness.  He says he has difficulty with his speech.   Left-sided weakness -MRI of brain and C-spine noted to be unremarkable -Neurology consult has been requested -Seen by physical therapy with recommendations for home health PT -Basic labs are unrevealing -He denies any recent medications -Denies any recent stressors  Await further input from neurology  [facility]

## 2021-04-27 NOTE — Evaluation (Signed)
Speech Language Pathology Evaluation Patient Details Name: Nathaniel Guerrero MRN: [id number] DOB: [birth year] Today's Date: 04/27/2021 Time: 5329-9242 SLP Time Calculation (min) (ACUTE ONLY): 24 min  Problem List:  Patient Active Problem List   Diagnosis Date Noted   Left-sided weakness 04/27/2021   Past Medical History: History reviewed. No pertinent past medical history. Past Surgical History:  Past Surgical History:  Procedure Laterality Date   ANKLE SURGERY Right 2014   HPI:  Nathaniel Guerrero is a 30 y.o. male with no previous past medical history who is coming to the emergency department due to left-sided weakness since Monday.  According to the patient and his significant other, they were at home outside in the  yard, then he went inside and fell from the couch.  He does not remember exactly how, but he remembers feeling lightheaded and having double vision for several minutes afterwards.  EMS was called and he was taken to the ED.  CT head was negative.  He was kept overnight in the hospital.  MRI without contrast done the following morning was negative.  His left-sided has continue but his double vision has not recurred.  He has a headache tonight, but no previous headache in the last few days.  He was seen at the [facility] ED for chest pain without any other associated symptoms on Sunday.  On Saturday he visited the same ER for right ankle pain.  Denied rhinorrhea, sore throat, dyspnea, wheezing, hemoptysis, orthopnea, lower extremity edema, abdominal pain, nausea, vomiting, diarrhea, constipation, melena or hematochezia.  No dysuria, frequency or gross hematuria.  No polyuria, polydipsia, polyphagia or blurred vision. SLE requested.   Assessment / Plan / Recommendation Clinical Impression  Pt seen in split sessions for cognitive linguistic evaluation. Pt initially presented with left facial droop (left lower lip/jaw) and reduced lingual and manibular movement, however when observed  with meal, he moved all WNL. Pt's speech characterized by robotic quality and child-like, but no dysarthria. SLP returned later to administer the SLUMS and Pt scored 26/30 with error on hand placement for clock drawing and recall of information from paragraph, essentially WNL. Pt's speech was noted to be improved and less robotic when seen for second session. SLP provided encouragement regarding improvement made and that Pt should expect to continue to make dramatic progress. SLP also discussed stressors and any history of depression and now the mind/body connection is powerful and outward symptoms can manifest from internal stressors. Pt indicates a history of depression and plans to follow up after discharge for counseling. He became tearful at times and expressed gratitude for the visit. No further SLP services indicated at this time.    SLP Assessment  SLP Recommendation/Assessment: Patient does not need any further Speech Lanaguage Pathology Services SLP Visit Diagnosis: Cognitive communication deficit (R41.841)    Follow Up Recommendations  Other (comment) (Counseling/pschologist)    Frequency and Duration           SLP Evaluation Cognition  Overall Cognitive Status: Within Functional Limits for tasks assessed Arousal/Alertness: Awake/alert Orientation Level: Oriented X4 Memory:  (5/5 delayed recall) Awareness: Appears intact Problem Solving: Appears intact Safety/Judgment: Appears intact       Comprehension  Auditory Comprehension Overall Auditory Comprehension: Appears within functional limits for tasks assessed Yes/No Questions: Within Functional Limits Commands: Within Functional Limits Conversation: Complex Visual Recognition/Discrimination Discrimination: Within Function Limits Reading Comprehension Reading Status: Within funtional limits    Expression Expression Primary Mode of Expression: Verbal Verbal Expression Overall Verbal Expression: Appears within functional  limits for tasks assessed Initiation: No impairment Automatic Speech: Name;Social Response Level of Generative/Spontaneous Verbalization: Conversation Repetition: No impairment Naming: No impairment Pragmatics: No impairment Non-Verbal Means of Communication: Not applicable Written Expression Dominant Hand: Right Written Expression: Not tested   Oral / Motor  Oral Motor/Sensory Function Overall Oral Motor/Sensory Function: Within functional limits (Pt initially presented with left facial asymmetry, but bilateral movement observed spontaneously) Motor Speech Overall Motor Speech: Appears within functional limits for tasks assessed Respiration: Within functional limits Phonation: Normal Resonance: Within functional limits Articulation: Within functional limitis Intelligibility: Intelligible Motor Planning: Witnin functional limits Motor Speech Errors: Not applicable   Thank you,  Havery Moros, CCC-SLP [phone]                     Khanh Cordner 04/27/2021, 4:33 PM

## 2021-04-27 NOTE — Consult Note (Signed)
[facility] TeleNeurology Consult Services  Stat Consult  Date of Service:   04/27/2021 04:28:55  Diagnosis:       I63.9 - Cerebrovascular accident (CVA), unspecified mechanism (HCC)  Impression Nathaniel Guerrero is a 30 yo M w/ a hx of reported stroke who presents with left sided weakness. Exam notable for right facial droop and left hemiparesis. Head CT showed no acute intracranial pathology. Ddx includes ischemic stroke vs CNS inflammatory disease. Out of the window for acute stroke therapy. Recommend the following:  - ASA 325 mg now  - Goal BP is normotension  - Bedside swallow eval  - MRI brain w/o contrast  -Telemetry  -TTE  -hemoglobin A1c, lipid panel  -PT/OT/SLP  -Neurology to follow      CT HEAD: Showed No Acute Hemorrhage or Acute Core Infarct  Our recommendations are outlined below.  Diagnostic Studies: Recommend MRI brain without contrast  Laboratory Studies: Recommend Lipid panel Hemoglobin A1c  Nursing Recommendations: Telemetry, IV Fluids, avoid dextrose containing fluids, Maintain euglycemia Neuro checks q4 hrs x 24 hrs and then per shift Head of bed 30 degrees  Consultations: Physical therapy/Occupational therapy  Disposition: Neurology will follow   Imaging Head CT: No acute intracranial pathology  Metrics: [facility] Notification Time: 04/27/2021 04:27:33 Stamp Time: 04/27/2021 04:28:55 Callback Response Time: 04/27/2021 04:31:32   ----------------------------------------------------------------------------------------------------  Chief Complaint: left sided weakness  History of Present Illness: Patient is a 30 year old Male.  Nathaniel Guerrero is a 30 yo M w/ a hx of reported stroke who presents with left sided weakness. Patient reports developing left sided weakness two days ago. Presented to an OSH and patient states he was told he had a stroke. Per our records, MRI was negative. Symptoms worsened prompting patient to  present today. Exam notable for right facial droop and left hemiparesis. Head CT showed no acute intracranial pathology.   Anticoagulant use:  No  Antiplatelet use: No   Examination: BP(115/65), Pulse(71), Blood Glucose(91) 1A: Level of Consciousness - Alert; keenly responsive + 0 1B: Ask Month and Age - Both Questions Right + 0 1C: Blink Eyes & Squeeze Hands - Performs Both Tasks + 0 2: Test Horizontal Extraocular Movements - Normal + 0 3: Test Visual Fields - No Visual Loss + 0 4: Test Facial Palsy (Use Grimace if Obtunded) - Minor paralysis (flat nasolabial fold, smile asymmetry) + 1 5A: Test Left Arm Motor Drift - Drift, but doesn't hit bed + 1 5B: Test Right Arm Motor Drift - No Drift for 10 Seconds + 0 6A: Test Left Leg Motor Drift - Drift, but doesn't hit bed + 1 6B: Test Right Leg Motor Drift - No Drift for 5 Seconds + 0 7: Test Limb Ataxia (FNF/Heel-Shin) - No Ataxia + 0 8: Test Sensation - Mild-Moderate Loss: Less Sharp/More Dull + 1 9: Test Language/Aphasia - Normal; No aphasia + 0 10: Test Dysarthria - Mild-Moderate Dysarthria: Slurring but can be understood + 1 11: Test Extinction/Inattention - No abnormality + 0  NIHSS Score: 5     Patient / Family was informed the Neurology Consult would occur via TeleHealth consult by way of interactive audio and video telecommunications and consented to receiving care in this manner.  Patient is being evaluated for possible acute neurologic impairment and high probability of imminent or life - threatening deterioration.I spent total of 21 minutes providing care to this patient, including time for face to face visit via telemedicine, review of medical records, imaging studies and discussion of findings with  providers, the patient and / or family.   Dr Vicente Masson   [facility] [phone]  Case [id number]

## 2021-04-27 NOTE — Plan of Care (Signed)
  Problem: Acute Rehab PT Goals(only PT should resolve) Goal: Pt Will Go Supine/Side To Sit Outcome: Progressing Flowsheets (Taken 04/27/2021 1520) Pt will go Supine/Side to Sit:  Independently  with modified independence Goal: Patient Will Transfer Sit To/From Stand Outcome: Progressing Flowsheets (Taken 04/27/2021 1520) Patient will transfer sit to/from stand: with supervision Goal: Pt Will Transfer Bed To Chair/Chair To Bed Outcome: Progressing Flowsheets (Taken 04/27/2021 1520) Pt will Transfer Bed to Chair/Chair to Bed: with supervision Goal: Pt Will Ambulate Outcome: Progressing Flowsheets (Taken 04/27/2021 1520) Pt will Ambulate:  100 feet  with supervision  with rolling walker   3:20 PM, 04/27/21 Ocie Bob, MPT Physical Therapist with [facility] [phone] office [phone] mobile phone

## 2021-04-27 NOTE — Consult Note (Addendum)
[facility] Sangita Zani A. [name], MD     [email or web address]          Mazin Emma is an 30 y.o. male.   ASSESSMENT/PLAN: UNEXPLAINED MILD LEFT-SIDED HEMIPARESIS, FACIAL WEAKNESS AND DYSARTHRIA: Imaging repeatedly has been negative. There is associated headache and nausea suggestive of possible complex migraine. Additionally, also consider seizure and the psychogenic etiology.  Acute dystonia is also possibility although the patient has not been on typical medications that cause acute dystonia. EEG is suggested along with other labs. Treat headache and nausea symptomatically. Anisocoria -  This is thought to be physiologic and normal for the patient  3.   Chest pain     This is a 30 year old right-handed white male who presents with acute chest pain syndrome few days ago. He was seen at [facility] and evaluated.  The chest pain is retrosternal and he continues to have the pain although workup apparently has been unrevealing. He was released and sent home but the following day he developed left-sided weakness. The weakness has progressed. He was seen in the hospital at [facility] and had a imaging as outlined below with MRI CT of the head and neck which were all unrevealing. The [facility] notes indicates that there is a history of diabetes mellitus although this is not indicated in our records and not by the patient.  The patient returned to seek medical attention at this hospital because of worsening left-sided weakness associated with dysarthria. He recently developed frontal headaches and nausea today and had to get something for these symptoms. He also had the some dizziness. He reports also having numbness of the left side. Patient lives with his fiancee and children. There is some report that there is underlying psychosocial stresses with the employment and other related issues. The review systems otherwise negative.    GENERAL:  On entering the room, the lights are  out and the patient is complaining of being cold with blankets on.  HEENT:  Neck is supple no trauma noted. No sweating noted.  ABDOMEN: soft  EXTREMITIES: No edema   BACK: Normal  SKIN: Normal by inspection.    MENTAL STATUS: Alert and oriented.  There is a mild to moderate dysarthria. Language and cognition are generally intact. Judgment and insight normal.   CRANIAL NERVES: OS  7 mm and the OD  6 mm.  No change in the difference between darkness or light. The extra ocular movements are full, there is no significant nystagmus; visual fields are full;  There is flattening of the nasolabial fold on the right with the significant right lower facial weakness. The tongue is midline.  MOTOR:  There is mild downward drift of the left upper extremity. The left hand is says slightly flexed then. There is mild the impairment of hand dexterity and finger tapping on the left. There is also mild impairment of left foot tapping. Apart from the weakness of the left hand the left foot, there is actually good strength on the left side. The right side shows normal tone, bulk and strength.  COORDINATION: Left finger to nose is normal, right finger to nose is normal, No rest tremor; no intention tremor; no postural tremor; no bradykinesia.  REFLEXES: Deep tendon reflexes are symmetrical and normal.   SENSATION:  Loraine Leriche is reduction to temperature, light touch and pain involving the left upper extremity left leg.       [facility] Course  Patient with history of diabetes on metformin came to the  hospital complaint of unclear episodes of weakness in extremities. Patient was seen earlier in the day with chest pain was ruled out and discharged home with came back following report of weakness in extremities and a fall at home. CT angiogram of brain and neck was negative. MRI of the brain was negative. 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 Patient's weakness  which later reported was in the left arm has resolved. Was seen eating with his left arm in emergency room. Vital signs have been fairly stable. Patient discharged in stable condition.  Blood pressure 123/78, pulse 84, temperature 98.2 F (36.8 C), temperature source Oral, resp. rate 16, height 6\' 3"  (1.905 m), weight 111.4 kg, SpO2 96 %.  History reviewed. No pertinent past medical history.  Past Surgical History:  Procedure Laterality Date   ANKLE SURGERY Right 2014    Family History  Problem Relation Age of Onset   Heart Problems Mother    Diabetes Mother    Heart Problems Father    Stroke Maternal Grandmother    Stroke Paternal Grandmother     Social History:  reports that he has never smoked. He has never used smokeless tobacco. He reports that he does not drink alcohol and does not use drugs.  Allergies:  Allergies  Allergen Reactions   Toradol [Ketorolac Tromethamine]     rash    Medications: Prior to Admission medications   Medication Sig Start Date End Date Taking? Authorizing Provider  predniSONE (DELTASONE) 10 MG tablet Take 2 tablets (20 mg total) by mouth daily. Patient not taking: Reported on 04/27/2021 07/27/20   13/3/21, MD    Scheduled Meds:  enoxaparin (LOVENOX) injection  40 mg Subcutaneous Q24H   Continuous Infusions: PRN Meds:.acetaminophen **OR** acetaminophen, ondansetron **OR** ondansetron (ZOFRAN) IV     Results for orders placed or performed during the hospital encounter of 04/26/21 (from the past 48 hour(s))  Basic metabolic panel     Status: None   Collection Time: 04/27/21 12:19 AM  Result Value Ref Range   Sodium 137 135 - 145 mmol/L   Potassium 3.7 3.5 - 5.1 mmol/L   Chloride 102 98 - 111 mmol/L   CO2 27 22 - 32 mmol/L   Glucose, Bld 91 70 - 99 mg/dL    Comment: Glucose reference range applies only to samples taken after fasting for at least 8 hours.   BUN 17 6 - 20 mg/dL   Creatinine, Ser 06/27/21 0.61 - 1.24 mg/dL   Calcium  9.4 8.9 - 2.95 mg/dL   GFR, Estimated 28.4 >13 mL/min    Comment: (NOTE) Calculated using the CKD-EPI Creatinine Equation (2021)    Anion gap 8 5 - 15    Comment: Performed at [facility], [address], [place], [place]  CBC with Differential     Status: None   Collection Time: 04/27/21 12:19 AM  Result Value Ref Range   WBC 7.1 4.0 - 10.5 K/uL   RBC 5.32 4.22 - 5.81 MIL/uL   Hemoglobin 16.7 13.0 - 17.0 g/dL   HCT 06/27/21 72.5 - 36.6 %   MCV 94.4 80.0 - 100.0 fL   MCH 31.4 26.0 - 34.0 pg   MCHC 33.3 30.0 - 36.0 g/dL   RDW 44.0 34.7 - 42.5 %   Platelets 272 150 - 400 K/uL   nRBC 0.0 0.0 - 0.2 %   Neutrophils Relative % 75 %   Neutro Abs 5.3 1.7 - 7.7 K/uL  Lymphocytes Relative 19 %   Lymphs Abs 1.4 0.7 - 4.0 K/uL   Monocytes Relative 6 %   Monocytes Absolute 0.5 0.1 - 1.0 K/uL   Eosinophils Relative 0 %   Eosinophils Absolute 0.0 0.0 - 0.5 K/uL   Basophils Relative 0 %   Basophils Absolute 0.0 0.0 - 0.1 K/uL   Immature Granulocytes 0 %   Abs Immature Granulocytes 0.02 0.00 - 0.07 K/uL    Comment: Performed at [facility], [address] [place], [place] [address]  Protime-INR     Status: None   Collection Time: 04/27/21 12:19 AM  Result Value Ref Range   Prothrombin Time 13.1 11.4 - 15.2 seconds   INR 1.0 0.8 - 1.2    Comment: (NOTE) INR goal varies based on device and disease states. Performed at [facility], [address] [place], [place] [address]   Resp Panel by RT-PCR (Flu A&B, Covid) Nasopharyngeal Swab     Status: None   Collection Time: 04/27/21  2:02 AM   Specimen: Nasopharyngeal Swab; Nasopharyngeal(NP) swabs in vial transport medium  Result Value Ref Range   SARS Coronavirus 2 by RT PCR NEGATIVE NEGATIVE    Comment: (NOTE) SARS-CoV-2 target nucleic acids are NOT DETECTED.  The SARS-CoV-2 RNA is generally detectable in upper respiratory specimens during the acute phase of infection. The lowest concentration of SARS-CoV-2 viral copies this assay  can detect is 138 copies/mL. A negative result does not preclude SARS-Cov-2 infection and should not be used as the sole basis for treatment or other patient management decisions. A negative result may occur with  improper specimen collection/handling, submission of specimen other than nasopharyngeal swab, presence of viral mutation(s) within the areas targeted by this assay, and inadequate number of viral copies(<138 copies/mL). A negative result must be combined with clinical observations, patient history, and epidemiological information. The expected result is Negative.  Fact Sheet for Patients:  BloggerCourse.comhttps://www.fda.gov/media/152166/download  Fact Sheet for Healthcare Providers:  SeriousBroker.ithttps://www.fda.gov/media/152162/download  This test is no t yet approved or cleared by the [place] FDA and  has been authorized for detection and/or diagnosis of SARS-CoV-2 by FDA under an Emergency Use Authorization (EUA). This EUA will remain  in effect (meaning this test can be used) for the duration of the COVID-19 declaration under Section 564(b)(1) of the Act, 21 U.S.C.section 360bbb-3(b)(1), unless the authorization is terminated  or revoked sooner.       Influenza A by PCR NEGATIVE NEGATIVE   Influenza B by PCR NEGATIVE NEGATIVE    Comment: (NOTE) The Xpert Xpress SARS-CoV-2/FLU/RSV plus assay is intended as an aid in the diagnosis of influenza from Nasopharyngeal swab specimens and should not be used as a sole basis for treatment. Nasal washings and aspirates are unacceptable for Xpert Xpress SARS-CoV-2/FLU/RSV testing.  Fact Sheet for Patients: BloggerCourse.comhttps://www.fda.gov/media/152166/download  Fact Sheet for Healthcare Providers: SeriousBroker.ithttps://www.fda.gov/media/152162/download  This test is not yet approved or cleared by the [place] FDA and has been authorized for detection and/or diagnosis of SARS-CoV-2 by FDA under an Emergency Use Authorization (EUA). This EUA will remain in effect  (meaning this test can be used) for the duration of the COVID-19 declaration under Section 564(b)(1) of the Act, 21 U.S.C. section 360bbb-3(b)(1), unless the authorization is terminated or revoked.  Performed at [facility], [address] [place], [place] [address]     Studies/Results:  C SPINE MRI  The intervertebral disc spaces are preserved. There are minimal disc protrusions at C5-C6 and C6-C7 without significant spinal canal  or neural foraminal stenosis. There is no significant spinal canal or neural foraminal stenosis at the remaining levels.   IMPRESSION: Essentially normal cervical spine MRI with no finding to explain the patient's symptoms.   BRAIN MRI  W/WO IMPRESSION: Normal MRI brain with contrast.      The brain MRI is reviewed in person and shows no acute changes on DWI. No abnormal the signal is seen on FLAIR. No hemorrhages noted. No abnormal enhancement is noted.          Artavious Trebilcock A. [name], M.D.  Diplomate, Biomedical engineer of Psychiatry and Neurology ( Neurology). 04/27/2021, 7:36 PM

## 2021-04-27 NOTE — H&P (Signed)
History and Physical    Nathaniel Guerrero OFB:[id number] DOB: 1991/08/21 DOA: 04/26/2021  PCP: Pcp, No  Patient coming from: Home.  I have personally briefly reviewed patient's old medical records in [facility]  Chief Complaint: Left-sided weakness.  HPI: Nathaniel Guerrero is a 30 y.o. male with no previous past medical history who is coming to the emergency department due to left-sided weakness since Monday.  According to the patient and his significant other, they were at home outside in the  yard, then he went inside and fell from the couch.  He does not remember exactly how, but he remembers feeling lightheaded and having double vision for several minutes afterwards.  EMS was called and he was taken to the ED.  CT head was negative.  He was kept overnight in the hospital.  MRI without contrast done the following morning was negative.  His left-sided has continue but his double vision has not recurred.  He has a headache tonight, but no previous headache in the last few days.  He was seen at the [facility] ED for chest pain without any other associated symptoms on Sunday.  On Saturday he visited the same ER for right ankle pain.  Denied rhinorrhea, sore throat, dyspnea, wheezing, hemoptysis, orthopnea, lower extremity edema, abdominal pain, nausea, vomiting, diarrhea, constipation, melena or hematochezia.  No dysuria, frequency or gross hematuria.  No polyuria, polydipsia, polyphagia or blurred vision.  ED Course: Initial vital signs were temperature 98.7 F, pulse 83, respiration 18, BP 123/87 mmHg O2 sat 100% on room air.  Lab work: CBC, PT/INR and BMP were normal.  Imaging: CT head with contrast was negative.  Review of Systems: As per HPI otherwise all other systems reviewed and are negative.  History reviewed. No pertinent past medical history.  Past Surgical History:  Procedure Laterality Date   ANKLE SURGERY Right 2014    Social History  reports that he has never smoked.  He has never used smokeless tobacco. He reports that he does not drink alcohol and does not use drugs.  Allergies  Allergen Reactions   Toradol [Ketorolac Tromethamine]     rash    Family History  Problem Relation Age of Onset   Heart Problems Mother    Diabetes Mother    Heart Problems Father    Stroke Maternal Grandmother    Stroke Paternal Grandmother    Prior to Admission medications   Medication Sig Start Date End Date Taking? Authorizing Provider  predniSONE (DELTASONE) 10 MG tablet Take 2 tablets (20 mg total) by mouth daily. 07/27/20   Bethann Berkshire, MD    Physical Exam: Vitals:   04/27/21 0330 04/27/21 0405 04/27/21 0430 04/27/21 0500  BP: 125/66 135/73 118/63 115/65  Pulse: (!) 57 69 67 71  Resp: 19 17 17 17   Temp:      TempSrc:      SpO2: 95% 99% 99% 99%  Weight:      Height:       Constitutional: NAD, calm, comfortable Eyes: PERRL, lids and conjunctivae normal ENMT: Mucous membranes are moist. Posterior pharynx clear of any exudate or lesions. Neck: normal, supple, no masses, no thyromegaly Respiratory: clear to auscultation bilaterally, no wheezing, no crackles. Normal respiratory effort. No accessory muscle use.  Cardiovascular: Regular rate and rhythm, no murmurs / rubs / gallops. No extremity edema. 2+ pedal pulses. No carotid bruits.  Abdomen: Obese, no distention.  Bowel sounds positive.  Soft, no tenderness, no masses palpated. No hepatosplenomegaly.  Neither  Musculoskeletal: no clubbing / cyanosis. Good ROM, no contractures. Normal muscle tone.  Skin: no rashes, lesions, ulcers on limited dermatological examination. Neurologic: Left facial droop, otherwise CN 2-12 grossly intact.  Subjective decrease in sensation on left side.  DTR normal.  Difficult to grade left-sided hemiparesis.  Unable to evaluate gait. Psychiatric: Normal judgment and insight. Alert and oriented x 3. Normal mood.   Labs on Admission: I have personally reviewed following labs and  imaging studies  CBC: Recent Labs  Lab 04/27/21 0019  WBC 7.1  NEUTROABS 5.3  HGB 16.7  HCT 50.2  MCV 94.4  PLT 272    Basic Metabolic Panel: Recent Labs  Lab 04/27/21 0019  NA 137  K 3.7  CL 102  CO2 27  GLUCOSE 91  BUN 17  CREATININE 1.01  CALCIUM 9.4    GFR: Estimated Creatinine Clearance: 141.3 mL/min (by C-G formula based on SCr of 1.01 mg/dL).  Liver Function Tests: No results for input(s): AST, ALT, ALKPHOS, BILITOT, PROT, ALBUMIN in the last 168 hours.  Urine analysis: No results found for: COLORURINE, APPEARANCEUR, LABSPEC, PHURINE, GLUCOSEU, HGBUR, BILIRUBINUR, KETONESUR, PROTEINUR, UROBILINOGEN, NITRITE, LEUKOCYTESUR  Radiological Exams on Admission: CT HEAD WO CONTRAST ( )  Result Date: 04/26/2021 CLINICAL DATA:  History of fall 2 nights ago.  Left-sided weakness. EXAM: CT HEAD WITHOUT CONTRAST TECHNIQUE: Contiguous axial images were obtained from the base of the skull through the vertex without intravenous contrast. COMPARISON:  MRI brain 04/26/2019 and prior head CT 04/24/2021 FINDINGS: Brain: The ventricles are in the midline without mass effect or shift. Incidental cavum septum pellucidum again noted. No extra-axial fluid collections are identified. The gray-white differentiation is maintained. No acute intracranial findings or mass lesions. Brainstem and cerebellum are unremarkable. Vascular: No vascular calcifications or hyperdense vessels. Skull: No skull fracture or bone lesions. Sinuses/Orbits: Paranasal sinuses and mastoid air cells are clear. The globes are intact. Other: No scalp lesions or scalp hematoma. IMPRESSION: Normal and stable head CT. Electronically Signed   By: Rudie Meyer M.D.   On: 04/26/2021 20:29    EKG: Independently reviewed. Vent. rate 66 BPM PR interval 137 ms QRS duration 94 ms QT/QTcB 383/402 ms P-R-T axes 72 94 59 Sinus rhythm Borderline right axis deviation  Assessment/Plan Principal Problem:   Left-sided  weakness Questionable somatization. Observation/telemetry. Per Dr. Judd Lien teleneurology recommended: MRI brain with/without contrast Inpatient neurology consult. Check a swallow screen. hemoglobin A1c and fasting lipids. Further work-up to be determined by neurology post evaluation.   DVT prophylaxis: Lovenox SQ. Code Status:   Full. Family Communication:  Lynford Citizen, his significant other was by bedside. Disposition Plan:   Patient is from:  Home.  Anticipated DC to:  Home.  Anticipated DC date:  04/27/2021 or 04/28/2021.  Anticipated DC barriers: Pending work-up/consultant sign off.  Consults called:  Routine neurology consult. Admission status:  Observation/telemetry.  Severity of Illness:  High severity in the setting of new left-sided weakness.  Teleneurology recommended brain MRI with and without contrast and in person neurology consult.  Bobette Mo MD [facility]  How to contact the [facility] Attending or Consulting provider 7A - 7P or covering provider during after hours 7P -7A, for this patient?   Check the care team in [facility] and look for a) attending/consulting [facility] provider listed and b) the [facility] team listed Log into [email or web address] and use [place]'s universal password to access. If you do not have the password, please contact the hospital operator. Locate the [facility] provider you  are looking for under [facility] and page to a number that you can be directly reached. If you still have difficulty reaching the provider, please page the [facility] (Director on Call) for the Hospitalists listed on amion for assistance.  04/27/2021, 6:39 AM   This document was prepared using Dragon voice recognition software and may contain some unintended transcription errors

## 2021-04-27 NOTE — Evaluation (Signed)
Physical Therapy Evaluation Patient Details Name: Nathaniel Guerrero MRN: [id number] DOB: Jan 31, 1991 Today's Date: 04/27/2021   History of Present Illness  Nathaniel Guerrero is a 30 y.o. male with no previous past medical history who is coming to the emergency department due to left-sided weakness since Monday.  According to the patient and his significant other, they were at home outside in the  yard, then he went inside and fell from the couch.  He does not remember exactly how, but he remembers feeling lightheaded and having double vision for several minutes afterwards.  EMS was called and he was taken to the ED.  CT head was negative.  He was kept overnight in the hospital.  MRI without contrast done the following morning was negative.  His left-sided has continue but his double vision has not recurred.  He has a headache tonight, but no previous headache in the last few days.  He was seen at the [facility] ED for chest pain without any other associated symptoms on Sunday.  On Saturday he visited the same ER for right ankle pain.  Denied rhinorrhea, sore throat, dyspnea, wheezing, hemoptysis, orthopnea, lower extremity edema, abdominal pain, nausea, vomiting, diarrhea, constipation, melena or hematochezia.  No dysuria, frequency or gross hematuria.  No polyuria, polydipsia, polyphagia or blurred vision.   Clinical Impression  Patient demonstrates good return for sitting up at bedside, very unsteady on feet when standing supporting self with RUE, required use of RW for safety and able to ambulate in room/hallway without loss of balance, but drags left foot due to limited active ankle dorsiflexion and easily fatigued.  Patient put back to bed with family members in room.  Patient will benefit from continued physical therapy in hospital and recommended venue below to increase strength, balance, endurance for safe ADLs and gait.     Follow Up Recommendations Home health PT    Equipment Recommendations   Rolling walker with 5" wheels;3in1 (PT)    Recommendations for Other Services       Precautions / Restrictions Precautions Precautions: Fall Restrictions Weight Bearing Restrictions: No      Mobility  Bed Mobility Overal bed mobility: Modified Independent                  Transfers Overall transfer level: Needs assistance Equipment used: Rolling walker (2 wheeled) Transfers: Sit to/from [employer] Sit to Stand: Min guard;Min assist Stand pivot transfers: Min guard       General transfer comment: very unsteady on feet with near loss of balance without AD, required use of RW for safety  Ambulation/Gait Ambulation/Gait assistance: Min guard Gait Distance (Feet): 50 Feet Assistive device: Rolling walker (2 wheeled) Gait Pattern/deviations: Decreased step length - right;Decreased step length - left;Decreased stance time - left;Decreased dorsiflexion - left;Decreased stride length Gait velocity: decreased   General Gait Details: unsafe to attempt gait without AD due to poor standing balance, required use of RW for safety demonstrating slow labored cadence with decreased step/stride length LLE, limited left dorsiflexion with dragging of left foot, no loss of balance and limited mostly due to c/o fatigue  Stairs            Wheelchair Mobility    Modified Rankin (Stroke Patients Only)       Balance Overall balance assessment: Needs assistance Sitting-balance support: Feet supported;No upper extremity supported Sitting balance-Leahy Scale: Good Sitting balance - Comments: seated at EOB   Standing balance support: During functional activity;Single extremity supported Standing balance-Leahy Scale:  Poor Standing balance comment: fair/good using RW                             Pertinent Vitals/Pain Pain Assessment: Faces Faces Pain Scale: Hurts a little bit Pain Location: chest pain Pain Descriptors / Indicators: Sore Pain  Intervention(s): Limited activity within patient's tolerance;Monitored during session    Home Living Family/patient expects to be discharged to:: Private residence Living Arrangements: Spouse/significant other;Other (Comment) (Fiance) Available Help at Discharge: Family;Available 24 hours/day Type of Home: Apartment Home Access: Ramped entrance     Home Layout: One level Home Equipment: Cane - single point;Grab bars - tub/shower      Prior Function Level of Independence: Independent with assistive device(s)         Comments: community ambulator using SPC, drives     Hand Dominance   Dominant Hand: Right    Extremity/Trunk Assessment   Upper Extremity Assessment Upper Extremity Assessment: Defer to OT evaluation    Lower Extremity Assessment Lower Extremity Assessment: Generalized weakness;LLE deficits/detail LLE Deficits / Details: grossly 4/5 except ankle dorsiflexion 3/5 with limited ROM LLE Sensation: decreased light touch LLE Coordination: decreased fine motor    Cervical / Trunk Assessment Cervical / Trunk Assessment: Normal  Communication   Communication: No difficulties  Cognition Arousal/Alertness: Awake/alert Behavior During Therapy: WFL for tasks assessed/performed Overall Cognitive Status: Within Functional Limits for tasks assessed                                        General Comments      Exercises     Assessment/Plan    PT Assessment Patient needs continued PT services  PT Problem List Decreased strength;Decreased activity tolerance;Decreased balance;Decreased mobility;Impaired sensation       PT Treatment Interventions DME instruction;Gait training;Stair training;Functional mobility training;Therapeutic activities;Therapeutic exercise;Patient/family education;Balance training    PT Goals (Current goals can be found in the Care Plan section)  Acute Rehab PT Goals Patient Stated Goal: return home with family to  assist PT Goal Formulation: With patient/family Time For Goal Achievement: 05/01/21 Potential to Achieve Goals: Good    Frequency Min 4X/week   Barriers to discharge        Co-evaluation               AM-PAC PT "6 Clicks" Mobility  Outcome Measure Help needed turning from your back to your side while in a flat bed without using bedrails?: None Help needed moving from lying on your back to sitting on the side of a flat bed without using bedrails?: None Help needed moving to and from a bed to a chair (including a wheelchair)?: A Little Help needed standing up from a chair using your arms (e.g., wheelchair or bedside chair)?: A Little Help needed to walk in hospital room?: A Little Help needed climbing 3-5 steps with a railing? : A Lot 6 Click Score: 19    End of Session   Activity Tolerance: Patient tolerated treatment well;Patient limited by fatigue Patient left: in bed;with call bell/phone within reach;with family/visitor present Nurse Communication: Mobility status PT Visit Diagnosis: Unsteadiness on feet (R26.81);Other abnormalities of gait and mobility (R26.89);Muscle weakness (generalized) (M62.81)    Time: 6384-5364 PT Time Calculation (min) (ACUTE ONLY): 27 min   Charges:   PT Evaluation $PT Eval Moderate Complexity: 1 Mod PT Treatments $Therapeutic Activity: 23-37 mins  3:17 PM, 04/27/21 Ocie Bob, MPT Physical Therapist with [facility] [phone] office [phone] mobile phone

## 2021-04-28 DIAGNOSIS — R531 Weakness: Secondary | ICD-10-CM | POA: Diagnosis not present

## 2021-04-28 LAB — LIPID PANEL
Cholesterol: 114 mg/dL (ref 0–200)
HDL: 39 mg/dL — ABNORMAL LOW (ref >40)
LDL Cholesterol: 66 mg/dL (ref 0–99)
Total CHOL/HDL Ratio: 2.9 RATIO
Triglycerides: 45 mg/dL (ref <150)
VLDL: 9 mg/dL (ref 0–40)

## 2021-04-28 LAB — RPR: RPR Ser Ql: NONREACTIVE

## 2021-04-28 LAB — HEMOGLOBIN A1C
Hgb A1c MFr Bld: 4.9 % (ref 4.8–5.6)
Mean Plasma Glucose: 93.93 mg/dL

## 2021-04-28 LAB — HIV ANTIBODY (ROUTINE TESTING W REFLEX): HIV Screen 4th Generation wRfx: NONREACTIVE

## 2021-04-28 MED ORDER — BUTALBITAL-APAP-CAFFEINE 50-325-40 MG PO TABS
1.0000 | ORAL_TABLET | Freq: Four times a day (QID) | ORAL | Status: DC | PRN
  Administered 2021-04-29: 1 via ORAL
  Filled 2021-04-28: qty 1

## 2021-04-28 MED ORDER — TOPIRAMATE 25 MG PO TABS
25.0000 mg | ORAL_TABLET | Freq: Two times a day (BID) | ORAL | Status: DC
  Administered 2021-04-28 – 2021-04-29 (×2): 25 mg via ORAL
  Filled 2021-04-28 (×2): qty 1

## 2021-04-28 MED ORDER — CYANOCOBALAMIN 1000 MCG/ML IJ SOLN
1000.0000 ug | Freq: Once | INTRAMUSCULAR | Status: AC
  Administered 2021-04-28: 1000 ug via INTRAMUSCULAR
  Filled 2021-04-28: qty 1

## 2021-04-28 MED ORDER — BUTALBITAL-APAP-CAFFEINE 50-325-40 MG PO TABS
1.0000 | ORAL_TABLET | ORAL | Status: AC
  Administered 2021-04-28: 1 via ORAL
  Filled 2021-04-28: qty 1

## 2021-04-28 MED ORDER — FOLIC ACID 1 MG PO TABS
1.0000 mg | ORAL_TABLET | Freq: Every day | ORAL | Status: DC
  Administered 2021-04-28 – 2021-04-29 (×2): 1 mg via ORAL
  Filled 2021-04-28 (×2): qty 1

## 2021-04-28 NOTE — Progress Notes (Signed)
PROGRESS NOTE    Nathaniel Guerrero  EUM:[id number] DOB: 10/28/1990 DOA: 04/26/2021 PCP: Pcp, No    Brief Narrative:  30 year old male admitted to the hospital with unexplained left-sided weakness, numbness, headache and difficulty with speech.  MRI imaging of the brain and C-spine unremarkable.  Neurology following for further work-up.   Assessment & Plan:   Principal Problem:   Left-sided weakness   Left-sided weakness -Etiology is unclear -Neurology following -MRI imaging of the C-spine and brain unremarkable, no evidence of stroke -He does complain of some headache, ?  Atypical migraine -B12 noted to be lower range of normal, could be contributing to numbness -Started on B12 replacement therapy -EEG ordered by neurology -Await further input from neurology -Seen by PT with recommendations for home health PT   DVT prophylaxis:   Lovenox  Code Status: Full code Family Communication: Discussed with his girlfriend at the bedside Disposition Plan: Status is: Observation  The patient remains OBS appropriate and will d/c before 2 midnights.  Dispo: The patient is from: Home              Anticipated d/c is to: Home              Patient currently is not medically stable to d/c.   Difficult to place patient No         Consultants:  Neurology  Procedures:    Antimicrobials:      Subjective: Has continued numbness on left side.  Feels that his left side is weaker.  Also complains of headache across his entire head.  Objective: Vitals:   04/27/21 2118 04/28/21 0418 04/28/21 0818 04/28/21 1354  BP: 129/74 90/70 108/66 108/71  Pulse: 76 74 66 69  Resp: 17 16 16 18   Temp: 98.6 F (37 C) 97.9 F (36.6 C) 97.8 F (36.6 C) 97.9 F (36.6 C)  TempSrc: Oral Oral Oral Oral  SpO2: 100% 100% 98% 98%  Weight:      Height:        Intake/Output Summary (Last 24 hours) at 04/28/2021 1852 Last data filed at 04/28/2021 1016 Gross per 24 hour  Intake 116 ml  Output --   Net 116 ml   Filed Weights   04/26/21 1938 04/27/21 0915  Weight: 108.9 kg 111.4 kg    Examination:  General exam: Appears calm and comfortable  Respiratory system: Clear to auscultation. Respiratory effort normal. Cardiovascular system: S1 & S2 heard, RRR. No JVD, murmurs, rubs, gallops or clicks. No pedal edema. Gastrointestinal system: Abdomen is nondistended, soft and nontender. No organomegaly or masses felt. Normal bowel sounds heard. Central nervous system: Alert and oriented.  Appears to have left-sided upper and lower extremity weakness, unclear if this is related to decreased effort Extremities: Symmetric 5 x 5 power. Skin: No rashes, lesions or ulcers Psychiatry: Judgement and insight appear normal. Mood & affect appropriate.     Data Reviewed: I have personally reviewed following labs and imaging studies  CBC: Recent Labs  Lab 04/27/21 0019  WBC 7.1  NEUTROABS 5.3  HGB 16.7  HCT 50.2  MCV 94.4  PLT 272   Basic Metabolic Panel: Recent Labs  Lab 04/27/21 0019  NA 137  K 3.7  CL 102  CO2 27  GLUCOSE 91  BUN 17  CREATININE 1.01  CALCIUM 9.4   GFR: Estimated Creatinine Clearance: 142.8 mL/min (by C-G formula based on SCr of 1.01 mg/dL). Liver Function Tests: No results for input(s): AST, ALT, ALKPHOS, BILITOT, PROT, ALBUMIN in the  last 168 hours. No results for input(s): LIPASE, AMYLASE in the last 168 hours. No results for input(s): AMMONIA in the last 168 hours. Coagulation Profile: Recent Labs  Lab 04/27/21 0019  INR 1.0   Cardiac Enzymes: No results for input(s): CKTOTAL, CKMB, CKMBINDEX, TROPONINI in the last 168 hours. BNP (last 3 results) No results for input(s): PROBNP in the last 8760 hours. HbA1C: Recent Labs    04/28/21 0535  HGBA1C 4.9   CBG: No results for input(s): GLUCAP in the last 168 hours. Lipid Profile: Recent Labs    04/28/21 0535  CHOL 114  HDL 39*  LDLCALC 66  TRIG 45  CHOLHDL 2.9   Thyroid Function  Tests: Recent Labs    04/27/21 2025  TSH 2.975   Anemia Panel: Recent Labs    04/27/21 2025  VITAMINB12 245   Sepsis Labs: No results for input(s): PROCALCITON, LATICACIDVEN in the last 168 hours.  Recent Results (from the past 240 hour(s))  Resp Panel by RT-PCR (Flu A&B, Covid) Nasopharyngeal Swab     Status: None   Collection Time: 04/27/21  2:02 AM   Specimen: Nasopharyngeal Swab; Nasopharyngeal(NP) swabs in vial transport medium  Result Value Ref Range Status   SARS Coronavirus 2 by RT PCR NEGATIVE NEGATIVE Final    Comment: (NOTE) SARS-CoV-2 target nucleic acids are NOT DETECTED.  The SARS-CoV-2 RNA is generally detectable in upper respiratory specimens during the acute phase of infection. The lowest concentration of SARS-CoV-2 viral copies this assay can detect is 138 copies/mL. A negative result does not preclude SARS-Cov-2 infection and should not be used as the sole basis for treatment or other patient management decisions. A negative result may occur with  improper specimen collection/handling, submission of specimen other than nasopharyngeal swab, presence of viral mutation(s) within the areas targeted by this assay, and inadequate number of viral copies(<138 copies/mL). A negative result must be combined with clinical observations, patient history, and epidemiological information. The expected result is Negative.  Fact Sheet for Patients:  BloggerCourse.com  Fact Sheet for Healthcare Providers:  SeriousBroker.it  This test is no t yet approved or cleared by the [place] FDA and  has been authorized for detection and/or diagnosis of SARS-CoV-2 by FDA under an Emergency Use Authorization (EUA). This EUA will remain  in effect (meaning this test can be used) for the duration of the COVID-19 declaration under Section 564(b)(1) of the Act, 21 U.S.C.section 360bbb-3(b)(1), unless the authorization is  terminated  or revoked sooner.       Influenza A by PCR NEGATIVE NEGATIVE Final   Influenza B by PCR NEGATIVE NEGATIVE Final    Comment: (NOTE) The Xpert Xpress SARS-CoV-2/FLU/RSV plus assay is intended as an aid in the diagnosis of influenza from Nasopharyngeal swab specimens and should not be used as a sole basis for treatment. Nasal washings and aspirates are unacceptable for Xpert Xpress SARS-CoV-2/FLU/RSV testing.  Fact Sheet for Patients: BloggerCourse.com  Fact Sheet for Healthcare Providers: SeriousBroker.it  This test is not yet approved or cleared by the [place] FDA and has been authorized for detection and/or diagnosis of SARS-CoV-2 by FDA under an Emergency Use Authorization (EUA). This EUA will remain in effect (meaning this test can be used) for the duration of the COVID-19 declaration under Section 564(b)(1) of the Act, 21 U.S.C. section 360bbb-3(b)(1), unless the authorization is terminated or revoked.  Performed at [facility], [address], [place], [place] [address]          Radiology  Studies: CT HEAD WO CONTRAST ( )  Result Date: 04/26/2021 CLINICAL DATA:  History of fall 2 nights ago.  Left-sided weakness. EXAM: CT HEAD WITHOUT CONTRAST TECHNIQUE: Contiguous axial images were obtained from the base of the skull through the vertex without intravenous contrast. COMPARISON:  MRI brain 04/26/2019 and prior head CT 04/24/2021 FINDINGS: Brain: The ventricles are in the midline without mass effect or shift. Incidental cavum septum pellucidum again noted. No extra-axial fluid collections are identified. The gray-white differentiation is maintained. No acute intracranial findings or mass lesions. Brainstem and cerebellum are unremarkable. Vascular: No vascular calcifications or hyperdense vessels. Skull: No skull fracture or bone lesions. Sinuses/Orbits: Paranasal sinuses and mastoid air cells are clear. The  globes are intact. Other: No scalp lesions or scalp hematoma. IMPRESSION: Normal and stable head CT. Electronically Signed   By: Rudie Meyer M.D.   On: 04/26/2021 20:29   MR BRAIN W WO CONTRAST  Result Date: 04/27/2021 CLINICAL DATA:  Acute neuro deficit with left-sided weakness 3 days EXAM: MRI HEAD WITHOUT AND WITH CONTRAST TECHNIQUE: Multiplanar, multiecho pulse sequences of the brain and surrounding structures were obtained without and with intravenous contrast. CONTRAST:  22mL GADAVIST GADOBUTROL 1 MMOL/ML IV SOLN COMPARISON:  MRI head 04/25/2021 FINDINGS: Brain: No acute infarction, hemorrhage, hydrocephalus, extra-axial collection or mass lesion. Normal white matter. Normal enhancement. Vascular: Normal arterial flow voids. Skull and upper cervical spine: Negative Sinuses/Orbits: Mucosal edema paranasal sinuses.  Negative orbit Other: None IMPRESSION: Normal MRI brain with contrast. Electronically Signed   By: Marlan Palau M.D.   On: 04/27/2021 09:34   MR CERVICAL SPINE WO CONTRAST  Result Date: 04/27/2021 CLINICAL DATA:  Myelopathy, dizziness and neck pain EXAM: MRI CERVICAL SPINE WITHOUT CONTRAST TECHNIQUE: Multiplanar, multisequence MR imaging of the cervical spine was performed. No intravenous contrast was administered. COMPARISON:  CT a neck 12/23/2020 FINDINGS: Alignment: Physiologic. Vertebrae: Marrow signal is normal. Vertebral body heights are preserved. Cord: Normal signal and morphology. Posterior Fossa, vertebral arteries, paraspinal tissues: Negative. Disc levels: The intervertebral disc spaces are preserved. There are minimal disc protrusions at C5-C6 and C6-C7 without significant spinal canal or neural foraminal stenosis. There is no significant spinal canal or neural foraminal stenosis at the remaining levels. IMPRESSION: Essentially normal cervical spine MRI with no finding to explain the patient's symptoms. Electronically Signed   By: Lesia Hausen MD   On: 04/27/2021 16:07         Scheduled Meds:  folic acid  1 mg Oral Daily   topiramate  25 mg Oral BID   Continuous Infusions:   LOS: 0 days    Time spent:    Erick Blinks, MD [facility]   If 7PM-7AM, please contact night-coverage [email or web address]  04/28/2021, 6:52 PM

## 2021-04-28 NOTE — Plan of Care (Signed)
  Problem: Acute Rehab OT Goals (only OT should resolve) Goal: Pt. Will Perform Grooming Flowsheets (Taken 04/28/2021 0804) Pt Will Perform Grooming:  with modified independence  standing Goal: Pt. Will Perform Upper Body Dressing Flowsheets (Taken 04/28/2021 0804) Pt Will Perform Upper Body Dressing:  with modified independence  sitting Goal: Pt. Will Perform Lower Body Dressing Flowsheets (Taken 04/28/2021 0804) Pt Will Perform Lower Body Dressing:  with modified independence  sitting/lateral leans  sit to/from stand Goal: Pt. Will Transfer To Toilet Flowsheets (Taken 04/28/2021 0804) Pt Will Transfer to Toilet:  with modified independence  ambulating  regular height toilet Goal: Pt. Will Perform Toileting-Clothing Manipulation Flowsheets (Taken 04/28/2021 0804) Pt Will Perform Toileting - Clothing Manipulation and hygiene:  with modified independence  sitting/lateral leans  sit to/from stand Goal: Pt/Caregiver Will Perform Home Exercise Program Flowsheets (Taken 04/28/2021 0804) Pt/caregiver will Perform Home Exercise Program:  Increased strength  Left upper extremity  Independently  With written HEP provided

## 2021-04-28 NOTE — Progress Notes (Signed)
Unable to get to [facility] today due to short staffing at [facility] - Dr. [name] made aware

## 2021-04-28 NOTE — Progress Notes (Signed)
[facility] Nathaniel Guerrero A. [name], MD     [email or web address]          Nathaniel Guerrero is an 30 y.o. male.   ASSESSMENT/PLAN: UNEXPLAINED MILD LEFT-SIDED HEMIPARESIS, FACIAL WEAKNESS AND DYSARTHRIA: Imaging repeatedly has been negative. There is associated headache and nausea suggestive of possible complex migraine. Additionally, also consider seizure and the psychogenic etiology.  Acute dystonia is also possibility although the patient has not been on typical medications that cause acute dystonia. EEG not available because of staffing issues. Treat headache and nausea symptomatically.  The patient has been started on Topamax to help with headache prophylaxis and also to treating case seizures are an issue. Vitamin B12 deficiency noted which has been treated. Anisocoria -  This is thought to be physiologic and normal for the patient  3.   Chest pain     The patient reports that he feels a weak all over and the is still in the room with the lights out. Examination has improved today. The patient is updated on his care and the is willing to be discharged  Tomorrow. Close follow-up is suggested in the office in about 2 weeks.   GENERAL:  On entering the room, the lights are out and the patient is complaining of being cold with blankets on.  HEENT:  Neck is supple no trauma noted. No sweating noted.  ABDOMEN: soft  EXTREMITIES: No edema   BACK: Normal  SKIN: Normal by inspection.    MENTAL STATUS: Alert and oriented.  There is a mild dysarthria. Language and cognition are generally intact. Judgment and insight normal.   CRANIAL NERVES: OS  7 mm and the OD  6 mm.  No change in the difference between darkness or light. The extra ocular movements are full, there is no significant nystagmus; visual fields are full;  There is flattening of the nasolabial fold on the right  with only mild weakness noted.  MOTOR:  There is mild downward drift of the left upper extremity. The left hand  is says slightly flexed then. There is mild the impairment of hand dexterity and finger tapping on the left. There is also mild impairment of left foot tapping. Apart from the weakness of the left hand the left foot, there is actually good strength on the left side. The right side shows normal tone, bulk and strength.  COORDINATION: Left finger to nose is normal, right finger to nose is normal, No rest tremor; no intention tremor; no postural tremor; no bradykinesia.       [facility] Course  Patient with history of diabetes on metformin came to the hospital complaint of unclear episodes of weakness in extremities. Patient was seen earlier in the day with chest pain was ruled out and discharged home with came back following report of weakness in extremities and a fall at home. CT angiogram of brain and neck was negative. MRI of the brain was negative. 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 Patient's weakness which later reported was in the left arm has resolved. Was seen eating with his left arm in emergency room. Vital signs have been fairly stable. Patient discharged in stable condition.  Blood pressure 108/71, pulse 69, temperature 97.9 F (36.6 C), temperature source Oral, resp. rate 18, height 6\' 3"  (1.905 m), weight 111.4 kg, SpO2 98 %.  History reviewed. No pertinent past medical history.  Past Surgical History:  Procedure Laterality Date   ANKLE SURGERY Right 2014  Family History  Problem Relation Age of Onset   Heart Problems Mother    Diabetes Mother    Heart Problems Father    Stroke Maternal Grandmother    Stroke Paternal Grandmother     Social History:  reports that he has never smoked. He has never used smokeless tobacco. He reports that he does not drink alcohol and does not use drugs.  Allergies:  Allergies  Allergen Reactions   Toradol [Ketorolac Tromethamine]     rash    Medications: Prior to Admission  medications   Medication Sig Start Date End Date Taking? Authorizing Provider  predniSONE (DELTASONE) 10 MG tablet Take 2 tablets (20 mg total) by mouth daily. Patient not taking: Reported on 04/27/2021 07/27/20   Bethann BerkshireZammit, Joseph, MD    Scheduled Meds:  folic acid  1 mg Oral Daily   Continuous Infusions: PRN Meds:.acetaminophen **OR** acetaminophen, butalbital-acetaminophen-caffeine, ondansetron **OR** ondansetron (ZOFRAN) IV     Results for orders placed or performed during the hospital encounter of 04/26/21 (from the past 48 hour(s))  Basic metabolic panel     Status: None   Collection Time: 04/27/21 12:19 AM  Result Value Ref Range   Sodium 137 135 - 145 mmol/L   Potassium 3.7 3.5 - 5.1 mmol/L   Chloride 102 98 - 111 mmol/L   CO2 27 22 - 32 mmol/L   Glucose, Bld 91 70 - 99 mg/dL    Comment: Glucose reference range applies only to samples taken after fasting for at least 8 hours.   BUN 17 6 - 20 mg/dL   Creatinine, Ser 1.611.01 0.61 - 1.24 mg/dL   Calcium 9.4 8.9 - 09.610.3 mg/dL   GFR, Estimated >04>60 >54>60 mL/min    Comment: (NOTE) Calculated using the CKD-EPI Creatinine Equation (2021)    Anion gap 8 5 - 15    Comment: Performed at [facility], [address] [place], [place] [address]  CBC with Differential     Status: None   Collection Time: 04/27/21 12:19 AM  Result Value Ref Range   WBC 7.1 4.0 - 10.5 K/uL   RBC 5.32 4.22 - 5.81 MIL/uL   Hemoglobin 16.7 13.0 - 17.0 g/dL   HCT 91.450.2 78.239.0 - 95.652.0 %   MCV 94.4 80.0 - 100.0 fL   MCH 31.4 26.0 - 34.0 pg   MCHC 33.3 30.0 - 36.0 g/dL   RDW 21.312.7 08.611.5 - 57.815.5 %   Platelets 272 150 - 400 K/uL   nRBC 0.0 0.0 - 0.2 %   Neutrophils Relative % 75 %   Neutro Abs 5.3 1.7 - 7.7 K/uL   Lymphocytes Relative 19 %   Lymphs Abs 1.4 0.7 - 4.0 K/uL   Monocytes Relative 6 %   Monocytes Absolute 0.5 0.1 - 1.0 K/uL   Eosinophils Relative 0 %   Eosinophils Absolute 0.0 0.0 - 0.5 K/uL   Basophils Relative 0 %   Basophils Absolute 0.0 0.0 - 0.1 K/uL    Immature Granulocytes 0 %   Abs Immature Granulocytes 0.02 0.00 - 0.07 K/uL    Comment: Performed at [facility], [address] [place], [place] [address]  Protime-INR     Status: None   Collection Time: 04/27/21 12:19 AM  Result Value Ref Range   Prothrombin Time 13.1 11.4 - 15.2 seconds   INR 1.0 0.8 - 1.2    Comment: (NOTE) INR goal varies based on device and disease states. Performed at [facility], [address] [place], [place]  [address]   Resp Panel by RT-PCR (Flu A&B, Covid) Nasopharyngeal Swab     Status: None   Collection Time: 04/27/21  2:02 AM   Specimen: Nasopharyngeal Swab; Nasopharyngeal(NP) swabs in vial transport medium  Result Value Ref Range   SARS Coronavirus 2 by RT PCR NEGATIVE NEGATIVE    Comment: (NOTE) SARS-CoV-2 target nucleic acids are NOT DETECTED.  The SARS-CoV-2 RNA is generally detectable in upper respiratory specimens during the acute phase of infection. The lowest concentration of SARS-CoV-2 viral copies this assay can detect is 138 copies/mL. A negative result does not preclude SARS-Cov-2 infection and should not be used as the sole basis for treatment or other patient management decisions. A negative result may occur with  improper specimen collection/handling, submission of specimen other than nasopharyngeal swab, presence of viral mutation(s) within the areas targeted by this assay, and inadequate number of viral copies(<138 copies/mL). A negative result must be combined with clinical observations, patient history, and epidemiological information. The expected result is Negative.  Fact Sheet for Patients:  BloggerCourse.com  Fact Sheet for Healthcare Providers:  SeriousBroker.it  This test is no t yet approved or cleared by the [place] FDA and  has been authorized for detection and/or diagnosis of SARS-CoV-2 by FDA under an Emergency Use Authorization (EUA). This EUA will remain   in effect (meaning this test can be used) for the duration of the COVID-19 declaration under Section 564(b)(1) of the Act, 21 U.S.C.section 360bbb-3(b)(1), unless the authorization is terminated  or revoked sooner.       Influenza A by PCR NEGATIVE NEGATIVE   Influenza B by PCR NEGATIVE NEGATIVE    Comment: (NOTE) The Xpert Xpress SARS-CoV-2/FLU/RSV plus assay is intended as an aid in the diagnosis of influenza from Nasopharyngeal swab specimens and should not be used as a sole basis for treatment. Nasal washings and aspirates are unacceptable for Xpert Xpress SARS-CoV-2/FLU/RSV testing.  Fact Sheet for Patients: BloggerCourse.com  Fact Sheet for Healthcare Providers: SeriousBroker.it  This test is not yet approved or cleared by the [place] FDA and has been authorized for detection and/or diagnosis of SARS-CoV-2 by FDA under an Emergency Use Authorization (EUA). This EUA will remain in effect (meaning this test can be used) for the duration of the COVID-19 declaration under Section 564(b)(1) of the Act, 21 U.S.C. section 360bbb-3(b)(1), unless the authorization is terminated or revoked.  Performed at [facility], [address] [place], [place] [address]   RPR     Status: None   Collection Time: 04/27/21  8:25 PM  Result Value Ref Range   RPR Ser Ql NON REACTIVE NON REACTIVE    Comment: Performed at [facility], [address], [place], [place] [address]  Vitamin B12     Status: None   Collection Time: 04/27/21  8:25 PM  Result Value Ref Range   Vitamin B-12 245 180 - 914 pg/mL    Comment: (NOTE) This assay is not validated for testing neonatal or myeloproliferative syndrome specimens for Vitamin B12 levels. Performed at [facility], [address] [place], [place] [address]   TSH     Status: None   Collection Time: 04/27/21  8:25 PM  Result Value Ref Range   TSH 2.975 0.350 - 4.500 uIU/mL    Comment:  Performed by a 3rd Generation assay with a functional sensitivity of <=0.01 uIU/mL. Performed at [facility], [address], [place], [place] [address]   Hemoglobin A1c     Status:  None   Collection Time: 04/28/21  5:35 AM  Result Value Ref Range   Hgb A1c MFr Bld 4.9 4.8 - 5.6 %    Comment: (NOTE) Pre diabetes:          5.7%-6.4%  Diabetes:              >6.4%  Glycemic control for   <7.0% adults with diabetes    Mean Plasma Glucose 93.93 mg/dL    Comment: Performed at [facility], [address] [place], [place] [address]  Lipid panel     Status: Abnormal   Collection Time: 04/28/21  5:35 AM  Result Value Ref Range   Cholesterol 114 0 - 200 mg/dL   Triglycerides 45 <209 mg/dL   HDL 39 (L) >47 mg/dL   Total CHOL/HDL Ratio 2.9 RATIO   VLDL 9 0 - 40 mg/dL   LDL Cholesterol 66 0 - 99 mg/dL    Comment:        Total Cholesterol/HDL:CHD Risk Coronary Heart Disease Risk Table                     Men   Women  1/2 Average Risk   3.4   3.3  Average Risk       5.0   4.4  2 X Average Risk   9.6   7.1  3 X Average Risk  23.4   11.0        Use the calculated Patient Ratio above and the CHD Risk Table to determine the patient's CHD Risk.        ATP III CLASSIFICATION (LDL):  <100     mg/dL   Optimal  096-283  mg/dL   Near or Above                    Optimal  130-159  mg/dL   Borderline  662-947  mg/dL   High  >654     mg/dL   Very High Performed at [facility], [address], [place], [place] [address]   HIV Antibody (routine testing w rflx)     Status: None   Collection Time: 04/28/21  5:35 AM  Result Value Ref Range   HIV Screen 4th Generation wRfx Non Reactive Non Reactive    Comment: Performed at [facility], [address] [place], [place] [address]    Studies/Results:  C SPINE MRI  The intervertebral disc spaces are preserved. There are minimal disc protrusions at C5-C6 and C6-C7 without significant spinal canal or neural foraminal stenosis. There  is no significant spinal canal or neural foraminal stenosis at the remaining levels.   IMPRESSION: Essentially normal cervical spine MRI with no finding to explain the patient's symptoms.   BRAIN MRI  W/WO IMPRESSION: Normal MRI brain with contrast.      The brain MRI is reviewed in person and shows no acute changes on DWI. No abnormal the signal is seen on FLAIR. No hemorrhages noted. No abnormal enhancement is noted.          Cleva Camero A. Gerilyn Guerrero, M.D.  Diplomate, Biomedical engineer of Psychiatry and Neurology ( Neurology). 04/28/2021, 6:19 PM

## 2021-04-28 NOTE — Progress Notes (Signed)
Physical Therapy Treatment Patient Details Name: Nathaniel Guerrero MRN: [id number] DOB: [birth year] Today's Date: 04/28/2021    History of Present Illness Nathaniel Guerrero is a 30 y.o. male with no previous past medical history who is coming to the emergency department due to left-sided weakness since Monday.  According to the patient and his significant other, they were at home outside in the  yard, then he went inside and fell from the couch.  He does not remember exactly how, but he remembers feeling lightheaded and having double vision for several minutes afterwards.  EMS was called and he was taken to the ED.  CT head was negative.  He was kept overnight in the hospital.  MRI without contrast done the following morning was negative.  His left-sided has continue but his double vision has not recurred.  He has a headache tonight, but no previous headache in the last few days.  He was seen at the [facility] ED for chest pain without any other associated symptoms on Sunday.  On Saturday he visited the same ER for right ankle pain.  Denied rhinorrhea, sore throat, dyspnea, wheezing, hemoptysis, orthopnea, lower extremity edema, abdominal pain, nausea, vomiting, diarrhea, constipation, melena or hematochezia.  No dysuria, frequency or gross hematuria.  No polyuria, polydipsia, polyphagia or blurred vision.    PT Comments    Patient completes bed mobility without assist and demonstrates good sitting tolerance and sitting balance EOB. Patient transfers to standing with RW with min g/ supervision for safety. Patient given cueing for lateral weight shifting and requires both verbal and tactile cueing to complete with UE support. Patient begins with lateral stepping at bedside with RW with sliding feet on floor despite cueing for improving foot clearance. Patient ambulates with shuffled cadence mostly sliding feet on floor with use of RW. Patient limited by fatigue and returns to bed at end of session. Patient  will benefit from continued physical therapy in hospital and recommended venue below to increase strength, balance, endurance for safe ADLs and gait.    Follow Up Recommendations  Home health PT     Equipment Recommendations  Rolling walker with 5" wheels;3in1 (PT)    Recommendations for Other Services       Precautions / Restrictions Precautions Precautions: Fall Restrictions Weight Bearing Restrictions: No    Mobility  Bed Mobility Overal bed mobility: Modified Independent                  Transfers Overall transfer level: Needs assistance Equipment used: Rolling walker (2 wheeled) Transfers: Sit to/from [employer] Transfers Sit to Stand: Min guard;Supervision Stand pivot transfers: Min guard;Supervision       General transfer comment: with RW  Ambulation/Gait Ambulation/Gait assistance: Min guard Gait Distance (Feet): 30 Feet Assistive device: Rolling walker (2 wheeled) Gait Pattern/deviations: Decreased stride length Gait velocity: decreased   General Gait Details: began with lateral stepping at bedside with patient sliding feet, Patient ambualtes with heavy UE support with shuffled gait pattern, limited by fatigue   Stairs             Wheelchair Mobility    Modified Rankin (Stroke Patients Only)       Balance Overall balance assessment: Needs assistance Sitting-balance support: Feet supported;No upper extremity supported Sitting balance-Leahy Scale: Good Sitting balance - Comments: seated at EOB   Standing balance support: During functional activity;Single extremity supported Standing balance-Leahy Scale: Fair Standing balance comment: fair using RW  Cognition Arousal/Alertness: Awake/alert Behavior During Therapy: WFL for tasks assessed/performed Overall Cognitive Status: Within Functional Limits for tasks assessed                                        Exercises       General Comments        Pertinent Vitals/Pain Pain Assessment: No/denies pain Pain Score: 10-Worst pain ever Pain Location: Headache Pain Descriptors / Indicators: Headache Pain Intervention(s): Limited activity within patient's tolerance;Monitored during session    Home Living Family/patient expects to be discharged to:: Private residence Living Arrangements: Spouse/significant other;Other (Comment) (Fiance) Available Help at Discharge: Family;Available 24 hours/day Type of Home: Apartment Home Access: Ramped entrance   Home Layout: One level Home Equipment: Cane - single point;Grab bars - tub/shower      Prior Function Level of Independence: Independent with assistive device(s)      Comments: community ambulator using SPC, drives, independent in ADLs   PT Goals (current goals can now be found in the care plan section) Acute Rehab PT Goals Patient Stated Goal: return home with family to assist PT Goal Formulation: With patient/family Time For Goal Achievement: 05/01/21 Potential to Achieve Goals: Good Progress towards PT goals: Progressing toward goals    Frequency    Min 4X/week      PT Plan Current plan remains appropriate    Co-evaluation              AM-PAC PT "6 Clicks" Mobility   Outcome Measure  Help needed turning from your back to your side while in a flat bed without using bedrails?: None Help needed moving from lying on your back to sitting on the side of a flat bed without using bedrails?: None Help needed moving to and from a bed to a chair (including a wheelchair)?: A Little Help needed standing up from a chair using your arms (e.g., wheelchair or bedside chair)?: A Little Help needed to walk in hospital room?: A Little Help needed climbing 3-5 steps with a railing? : A Lot 6 Click Score: 19    End of Session Equipment Utilized During Treatment: Gait belt Activity Tolerance: Patient tolerated treatment well;Patient limited by  fatigue Patient left: in bed;with call bell/phone within reach;with family/visitor present Nurse Communication: Mobility status PT Visit Diagnosis: Unsteadiness on feet (R26.81);Other abnormalities of gait and mobility (R26.89);Muscle weakness (generalized) (M62.81)     Time: 3557-3220 PT Time Calculation (min) (ACUTE ONLY): 12 min  Charges:  $Therapeutic Activity: 8-22 mins                    10:12 AM, 04/28/21 Wyman Songster PT, DPT Physical Therapist at [facility]

## 2021-04-28 NOTE — Evaluation (Signed)
Occupational Therapy Evaluation Patient Details Name: Nathaniel Guerrero MRN: [id number] DOB: 1991/05/24 Today's Date: 04/28/2021    History of Present Illness Nathaniel Guerrero is a 30 y.o. male with no previous past medical history who is coming to the emergency department due to left-sided weakness since Monday.  According to the patient and his significant other, they were at home outside in the  yard, then he went inside and fell from the couch.  He does not remember exactly how, but he remembers feeling lightheaded and having double vision for several minutes afterwards.  EMS was called and he was taken to the ED.  CT head was negative.  He was kept overnight in the hospital.  MRI without contrast done the following morning was negative.  His left-sided has continue but his double vision has not recurred.  He has a headache tonight, but no previous headache in the last few days.  He was seen at the [facility] ED for chest pain without any other associated symptoms on Sunday.  On Saturday he visited the same ER for right ankle pain.  MRI negative for acute changes.   Clinical Impression   Pt agreeable to OT evaluation this am. Pt reporting worsening weakness and loss of sensation along left side. Pt demonstrating decreased strength in LUE, however continues to have good proprioception and coordination despite reports of total numbness. Pt performing mobility tasks in room with RW, no LOB, does drag left foot. Recommend outpatient OT on discharge provided pt has transportation.     Follow Up Recommendations  Outpatient OT    Equipment Recommendations  None recommended by OT       Precautions / Restrictions Precautions Precautions: Fall Restrictions Weight Bearing Restrictions: No      Mobility Bed Mobility Overal bed mobility: Modified Independent                  Transfers Overall transfer level: Needs assistance Equipment used: Rolling walker (2 wheeled) Transfers: Sit  to/from [employer] Sit to Stand: Supervision Stand pivot transfers: Supervision       General transfer comment: completed with RW        ADL either performed or assessed with clinical judgement   ADL Overall ADL's : Needs assistance/impaired     Grooming: Wash/dry hands;Supervision/safety;Standing Grooming Details (indicate cue type and reason): pt standing at sink for grooming tasks, no difficulty reaching for items with LUE, no difficulty with proprioception or coordination                 Toilet Transfer: Supervision/safety;Ambulation;RW Toilet Transfer Details (indicate cue type and reason): simulated with bed transfer         Functional mobility during ADLs: Supervision/safety;Rolling walker General ADL Comments: Pt performing ADLs with supervision     Vision Baseline Vision/History: No visual deficits Patient Visual Report: No change from baseline Vision Assessment?: No apparent visual deficits            Pertinent Vitals/Pain Pain Assessment: 0-10 Pain Score: 10-Worst pain ever Pain Location: Headache Pain Descriptors / Indicators: Headache Pain Intervention(s): Limited activity within patient's tolerance;Monitored during session     Hand Dominance Right   Extremity/Trunk Assessment Upper Extremity Assessment Upper Extremity Assessment: LUE deficits/detail LUE Deficits / Details: LUE ROM at 50%, strength 4-/5, decreased grip strength LUE Sensation: decreased light touch LUE Coordination: WNL   Lower Extremity Assessment Lower Extremity Assessment: Defer to PT evaluation   Cervical / Trunk Assessment Cervical / Trunk Assessment: Normal  Communication Communication Communication: No difficulties   Cognition Arousal/Alertness: Awake/alert Behavior During Therapy: WFL for tasks assessed/performed Overall Cognitive Status: Within Functional Limits for tasks assessed                                                 Home Living Family/patient expects to be discharged to:: Private residence Living Arrangements: Spouse/significant other;Other (Comment) (Fiance) Available Help at Discharge: Family;Available 24 hours/day Type of Home: Apartment Home Access: Ramped entrance     Home Layout: One level     Bathroom Shower/Tub: Chief Strategy Officer: Standard     Home Equipment: Cane - single point;Grab bars - tub/shower      Lives With: Significant other    Prior Functioning/Environment Level of Independence: Independent with assistive device(s)        Comments: community ambulator using SPC, drives, independent in ADLs        OT Problem List: Decreased strength;Decreased activity tolerance;Impaired balance (sitting and/or standing);Impaired UE functional use;Impaired sensation      OT Treatment/Interventions: Self-care/ADL training;Therapeutic exercise;Neuromuscular education;Therapeutic activities;Patient/family education    OT Goals(Current goals can be found in the care plan section) Acute Rehab OT Goals Patient Stated Goal: return home with family to assist OT Goal Formulation: With patient Time For Goal Achievement: 05/12/21 Potential to Achieve Goals: Good  OT Frequency: Min 1X/week    AM-PAC OT "6 Clicks" Daily Activity     Outcome Measure Help from another person eating meals?: None Help from another person taking care of personal grooming?: None Help from another person toileting, which includes using toliet, bedpan, or urinal?: None Help from another person bathing (including washing, rinsing, drying)?: None Help from another person to put on and taking off regular upper body clothing?: None Help from another person to put on and taking off regular lower body clothing?: None 6 Click Score: 24   End of Session Equipment Utilized During Treatment: Gait belt;Rolling walker  Activity Tolerance: Patient tolerated treatment well Patient left: in bed;with  call bell/phone within reach  OT Visit Diagnosis: Muscle weakness (generalized) (M62.81)                Time: 4193-7902 OT Time Calculation (min): 13 min Charges:  OT General Charges $OT Visit: 1 Visit OT Evaluation $OT Eval Low Complexity: 1 Low   Ezra Sites, OTR/L  [phone] 04/28/2021, 7:59 AM

## 2021-04-29 DIAGNOSIS — R531 Weakness: Secondary | ICD-10-CM | POA: Diagnosis not present

## 2021-04-29 LAB — HOMOCYSTEINE: Homocysteine: 15.9 umol/L — ABNORMAL HIGH (ref 0.0–14.5)

## 2021-04-29 MED ORDER — TOPIRAMATE 25 MG PO TABS: 25.0000 mg | ORAL_TABLET | Freq: Two times a day (BID) | ORAL | 1 refills | Status: AC

## 2021-04-29 MED ORDER — CYANOCOBALAMIN 1000 MCG/ML IJ SOLN
1000.0000 ug | Freq: Once | INTRAMUSCULAR | Status: AC
  Administered 2021-04-29: 1000 ug via INTRAMUSCULAR
  Filled 2021-04-29: qty 1

## 2021-04-29 MED ORDER — FOLIC ACID 1 MG PO TABS: 1.0000 mg | ORAL_TABLET | Freq: Every day | ORAL | 1 refills | Status: AC

## 2021-04-29 MED ORDER — VITAMIN B-12 1000 MCG PO TABS: 1000.0000 ug | ORAL_TABLET | Freq: Every day | ORAL | 1 refills | Status: AC

## 2021-04-29 NOTE — Progress Notes (Signed)
Patient c/o headache at this time.  He describes it as as a throbbing sensation that feel like a band tightening around his head.  Rates it 7/10.  He also c/o chest pain that is sharp and radiates to the "weak side". Patient states both of these issues have been ongoing since he had "4 strokes and 2 seizures this week". Of note, patient was eating oreos with his left hand and demonstrating proper coordination and strength.  He is in no acute distress.  Voice is flat and monotone.

## 2021-04-29 NOTE — Plan of Care (Signed)
  Problem: Education: Goal: Knowledge of disease or condition will improve Outcome: Progressing   Problem: Coping: Goal: Will verbalize positive feelings about self Outcome: Progressing   

## 2021-04-29 NOTE — Discharge Summary (Signed)
Physician Discharge Summary  Nathaniel Guerrero OJJ:[id number] DOB: [birth year] DOA: 04/26/2021  PCP: Pcp, No  Admit date: 04/26/2021 Discharge date: 04/29/2021  Admitted From: Home Disposition: Home  Recommendations for Outpatient Follow-up:  Follow-up with neurology, Dr. [name] in 2 weeks  Home Health: Home health PT/OT Equipment/Devices: Dan Humphreys, 3in1  Discharge Condition: Stable CODE STATUS: Full code Diet recommendation: Regular diet  Brief/Interim Summary: 30 year old male admitted to the hospital with unexplained left-sided weakness, numbness, headache and difficulty with speech.  MRI imaging of the brain and C-spine unremarkable.  Neurology following for further work-up  Discharge Diagnoses:  Principal Problem:   Left-sided weakness  Left-sided weakness -MRI imaging of the brain and C-spine unremarkable, no evidence of stroke -B12 noted to be in lower range of normal, started on replacement therapy -Seen by neurology -Since he also did complain of some headache, possible etiologies include atypical migraine -He was started on Topamax -Of note, several providers did notice some inconsistencies in his exam -He will follow-up with neurology in 2 weeks for further evaluation -It does not appear that he needs further inpatient work-up and would not benefit from further inpatient hospital stay  Discharge Instructions  Discharge Instructions     Diet - low sodium heart healthy   Complete by: As directed    Increase activity slowly   Complete by: As directed       Allergies as of 04/29/2021       Reactions   Toradol [ketorolac Tromethamine]    rash        Medication List     STOP taking these medications    predniSONE 10 MG tablet Commonly known as: DELTASONE       TAKE these medications    folic acid 1 MG tablet Commonly known as: FOLVITE Take 1 tablet (1 mg total) by mouth daily.   topiramate 25 MG tablet Commonly known as: TOPAMAX Take 1 tablet (25  mg total) by mouth 2 (two) times daily.   vitamin B-12 1000 MCG tablet Commonly known as: CYANOCOBALAMIN Take 1 tablet (1,000 mcg total) by mouth daily.               Durable Medical Equipment  (From admission, onward)           Start     Ordered   04/28/21 1623  For home use only DME Walker rolling  Once       Question Answer Comment  Walker: With 5 Inch Wheels   Patient needs a walker to treat with the following condition Generalized weakness      04/28/21 1622   04/28/21 1623  For home use only DME 3 n 1  Once        04/28/21 1622            Follow-up Information     Beryle Beams, MD. Schedule an appointment as soon as possible for a visit in 2 week(s).   Specialty: Neurology Contact information: [address] [place] [address] [phone]                Allergies  Allergen Reactions   Toradol [Ketorolac Tromethamine]     rash    Consultations: Neurology   Procedures/Studies: CT HEAD WO CONTRAST ( )  Result Date: 04/26/2021 CLINICAL DATA:  History of fall 2 nights ago.  Left-sided weakness. EXAM: CT HEAD WITHOUT CONTRAST TECHNIQUE: Contiguous axial images were obtained from the base of the skull through the vertex without intravenous contrast. COMPARISON:  MRI brain 04/26/2019  and prior head CT 04/24/2021 FINDINGS: Brain: The ventricles are in the midline without mass effect or shift. Incidental cavum septum pellucidum again noted. No extra-axial fluid collections are identified. The gray-white differentiation is maintained. No acute intracranial findings or mass lesions. Brainstem and cerebellum are unremarkable. Vascular: No vascular calcifications or hyperdense vessels. Skull: No skull fracture or bone lesions. Sinuses/Orbits: Paranasal sinuses and mastoid air cells are clear. The globes are intact. Other: No scalp lesions or scalp hematoma. IMPRESSION: Normal and stable head CT. Electronically Signed   By: Rudie Meyer M.D.   On: 04/26/2021  20:29   MR BRAIN W WO CONTRAST  Result Date: 04/27/2021 CLINICAL DATA:  Acute neuro deficit with left-sided weakness 3 days EXAM: MRI HEAD WITHOUT AND WITH CONTRAST TECHNIQUE: Multiplanar, multiecho pulse sequences of the brain and surrounding structures were obtained without and with intravenous contrast. CONTRAST:  31mL GADAVIST GADOBUTROL 1 MMOL/ML IV SOLN COMPARISON:  MRI head 04/25/2021 FINDINGS: Brain: No acute infarction, hemorrhage, hydrocephalus, extra-axial collection or mass lesion. Normal white matter. Normal enhancement. Vascular: Normal arterial flow voids. Skull and upper cervical spine: Negative Sinuses/Orbits: Mucosal edema paranasal sinuses.  Negative orbit Other: None IMPRESSION: Normal MRI brain with contrast. Electronically Signed   By: Marlan Palau M.D.   On: 04/27/2021 09:34   MR CERVICAL SPINE WO CONTRAST  Result Date: 04/27/2021 CLINICAL DATA:  Myelopathy, dizziness and neck pain EXAM: MRI CERVICAL SPINE WITHOUT CONTRAST TECHNIQUE: Multiplanar, multisequence MR imaging of the cervical spine was performed. No intravenous contrast was administered. COMPARISON:  CT a neck 12/23/2020 FINDINGS: Alignment: Physiologic. Vertebrae: Marrow signal is normal. Vertebral body heights are preserved. Cord: Normal signal and morphology. Posterior Fossa, vertebral arteries, paraspinal tissues: Negative. Disc levels: The intervertebral disc spaces are preserved. There are minimal disc protrusions at C5-C6 and C6-C7 without significant spinal canal or neural foraminal stenosis. There is no significant spinal canal or neural foraminal stenosis at the remaining levels. IMPRESSION: Essentially normal cervical spine MRI with no finding to explain the patient's symptoms. Electronically Signed   By: Lesia Hausen MD   On: 04/27/2021 16:07      Subjective: Continues to complain of some weakness on the left side.  Discharge Exam: Vitals:   04/28/21 0818 04/28/21 1354 04/28/21 2313 04/29/21 0557  BP:  108/66 108/71 121/62 102/61  Pulse: 66 69 69 (!) 58  Resp: 16 18 15 18   Temp: 97.8 F (36.6 C) 97.9 F (36.6 C) 98.6 F (37 C) 97.7 F (36.5 C)  TempSrc: Oral Oral Oral Oral  SpO2: 98% 98% 97% 99%  Weight:      Height:        General: Pt is alert, awake, not in acute distress Cardiovascular: RRR, S1/S2 +, no rubs, no gallops Respiratory: CTA bilaterally, no wheezing, no rhonchi Abdominal: Soft, NT, ND, bowel sounds + Extremities: no edema, no cyanosis    The results of significant diagnostics from this hospitalization (including imaging, microbiology, ancillary and laboratory) are listed below for reference.     Microbiology: Recent Results (from the past 240 hour(s))  Resp Panel by RT-PCR (Flu A&B, Covid) Nasopharyngeal Swab     Status: None   Collection Time: 04/27/21  2:02 AM   Specimen: Nasopharyngeal Swab; Nasopharyngeal(NP) swabs in vial transport medium  Result Value Ref Range Status   SARS Coronavirus 2 by RT PCR NEGATIVE NEGATIVE Final    Comment: (NOTE) SARS-CoV-2 target nucleic acids are NOT DETECTED.  The SARS-CoV-2 RNA is generally detectable in upper respiratory specimens  during the acute phase of infection. The lowest concentration of SARS-CoV-2 viral copies this assay can detect is 138 copies/mL. A negative result does not preclude SARS-Cov-2 infection and should not be used as the sole basis for treatment or other patient management decisions. A negative result may occur with  improper specimen collection/handling, submission of specimen other than nasopharyngeal swab, presence of viral mutation(s) within the areas targeted by this assay, and inadequate number of viral copies(<138 copies/mL). A negative result must be combined with clinical observations, patient history, and epidemiological information. The expected result is Negative.  Fact Sheet for Patients:  BloggerCourse.com  Fact Sheet for Healthcare Providers:   SeriousBroker.it  This test is no t yet approved or cleared by the [place] FDA and  has been authorized for detection and/or diagnosis of SARS-CoV-2 by FDA under an Emergency Use Authorization (EUA). This EUA will remain  in effect (meaning this test can be used) for the duration of the COVID-19 declaration under Section 564(b)(1) of the Act, 21 U.S.C.section 360bbb-3(b)(1), unless the authorization is terminated  or revoked sooner.       Influenza A by PCR NEGATIVE NEGATIVE Final   Influenza B by PCR NEGATIVE NEGATIVE Final    Comment: (NOTE) The Xpert Xpress SARS-CoV-2/FLU/RSV plus assay is intended as an aid in the diagnosis of influenza from Nasopharyngeal swab specimens and should not be used as a sole basis for treatment. Nasal washings and aspirates are unacceptable for Xpert Xpress SARS-CoV-2/FLU/RSV testing.  Fact Sheet for Patients: BloggerCourse.com  Fact Sheet for Healthcare Providers: SeriousBroker.it  This test is not yet approved or cleared by the [place] FDA and has been authorized for detection and/or diagnosis of SARS-CoV-2 by FDA under an Emergency Use Authorization (EUA). This EUA will remain in effect (meaning this test can be used) for the duration of the COVID-19 declaration under Section 564(b)(1) of the Act, 21 U.S.C. section 360bbb-3(b)(1), unless the authorization is terminated or revoked.  Performed at [facility], [address], [place], [place] [address]      Labs: BNP (last 3 results) No results for input(s): BNP in the last 8760 hours. Basic Metabolic Panel: Recent Labs  Lab 04/27/21 0019  NA 137  K 3.7  CL 102  CO2 27  GLUCOSE 91  BUN 17  CREATININE 1.01  CALCIUM 9.4   Liver Function Tests: No results for input(s): AST, ALT, ALKPHOS, BILITOT, PROT, ALBUMIN in the last 168 hours. No results for input(s): LIPASE, AMYLASE in the last 168  hours. No results for input(s): AMMONIA in the last 168 hours. CBC: Recent Labs  Lab 04/27/21 0019  WBC 7.1  NEUTROABS 5.3  HGB 16.7  HCT 50.2  MCV 94.4  PLT 272   Cardiac Enzymes: No results for input(s): CKTOTAL, CKMB, CKMBINDEX, TROPONINI in the last 168 hours. BNP: Invalid input(s): POCBNP CBG: No results for input(s): GLUCAP in the last 168 hours. D-Dimer No results for input(s): DDIMER in the last 72 hours. Hgb A1c Recent Labs    04/28/21 0535  HGBA1C 4.9   Lipid Profile Recent Labs    04/28/21 0535  CHOL 114  HDL 39*  LDLCALC 66  TRIG 45  CHOLHDL 2.9   Thyroid function studies Recent Labs    04/27/21 2025  TSH 2.975   Anemia work up Recent Labs    04/27/21 2025  VITAMINB12 245   Urinalysis No results found for: COLORURINE, APPEARANCEUR, LABSPEC, PHURINE, GLUCOSEU, HGBUR, BILIRUBINUR, KETONESUR, PROTEINUR, UROBILINOGEN, NITRITE, LEUKOCYTESUR Sepsis Labs  Invalid input(s): PROCALCITONIN,  WBC,  LACTICIDVEN Microbiology Recent Results (from the past 240 hour(s))  Resp Panel by RT-PCR (Flu A&B, Covid) Nasopharyngeal Swab     Status: None   Collection Time: 04/27/21  2:02 AM   Specimen: Nasopharyngeal Swab; Nasopharyngeal(NP) swabs in vial transport medium  Result Value Ref Range Status   SARS Coronavirus 2 by RT PCR NEGATIVE NEGATIVE Final    Comment: (NOTE) SARS-CoV-2 target nucleic acids are NOT DETECTED.  The SARS-CoV-2 RNA is generally detectable in upper respiratory specimens during the acute phase of infection. The lowest concentration of SARS-CoV-2 viral copies this assay can detect is 138 copies/mL. A negative result does not preclude SARS-Cov-2 infection and should not be used as the sole basis for treatment or other patient management decisions. A negative result may occur with  improper specimen collection/handling, submission of specimen other than nasopharyngeal swab, presence of viral mutation(s) within the areas targeted by this  assay, and inadequate number of viral copies(<138 copies/mL). A negative result must be combined with clinical observations, patient history, and epidemiological information. The expected result is Negative.  Fact Sheet for Patients:  BloggerCourse.comhttps://www.fda.gov/media/152166/download  Fact Sheet for Healthcare Providers:  SeriousBroker.ithttps://www.fda.gov/media/152162/download  This test is no t yet approved or cleared by the [place] FDA and  has been authorized for detection and/or diagnosis of SARS-CoV-2 by FDA under an Emergency Use Authorization (EUA). This EUA will remain  in effect (meaning this test can be used) for the duration of the COVID-19 declaration under Section 564(b)(1) of the Act, 21 U.S.C.section 360bbb-3(b)(1), unless the authorization is terminated  or revoked sooner.       Influenza A by PCR NEGATIVE NEGATIVE Final   Influenza B by PCR NEGATIVE NEGATIVE Final    Comment: (NOTE) The Xpert Xpress SARS-CoV-2/FLU/RSV plus assay is intended as an aid in the diagnosis of influenza from Nasopharyngeal swab specimens and should not be used as a sole basis for treatment. Nasal washings and aspirates are unacceptable for Xpert Xpress SARS-CoV-2/FLU/RSV testing.  Fact Sheet for Patients: BloggerCourse.comhttps://www.fda.gov/media/152166/download  Fact Sheet for Healthcare Providers: SeriousBroker.ithttps://www.fda.gov/media/152162/download  This test is not yet approved or cleared by the [place] FDA and has been authorized for detection and/or diagnosis of SARS-CoV-2 by FDA under an Emergency Use Authorization (EUA). This EUA will remain in effect (meaning this test can be used) for the duration of the COVID-19 declaration under Section 564(b)(1) of the Act, 21 U.S.C. section 360bbb-3(b)(1), unless the authorization is terminated or revoked.  Performed at [facility], [address] [place], [place] [id number]      Time coordinating discharge: 35mins  SIGNED:   Erick BlinksJehanzeb Eilam Shrewsbury, MD  [facility] 04/29/2021, 8:36 PM   If 7PM-7AM, please contact night-coverage [email or web address]

## 2021-04-29 NOTE — Progress Notes (Signed)
Pt discharged in stable condition via wheelchair into the care of his significant other via private vehicle. VSS. Pt moves all extremities equal. No paralysis or weakness noted. Pt continues to alternate between monotone, flat and normal speech patterns, according to who is in the room and what is being discussed. Pt was agitated and stating that he was tired of waiting for DME to deliver his walker to his hospital room and was going to just take the hospital walker. Nurse informed pt that he could not take the hospital walker as this was hospital property and that he would need to wait for his walker to be delivered or he could leave without it. Pt elected to wait. SW was contacted and the walker was scheduled to be delivered to [address], [place], [place] [address] per pt's request. Pt was informed of the delivery and verbalized understanding. Discharge instructions were reviewed with pt/significant other. Pt/significant other verbalized understanding. RAC PIV was removed intact, w/o and S&S of complications. Pt belongings were sent with pt.

## 2021-04-29 NOTE — Progress Notes (Signed)
Received report from Warren, Charity fundraiser. Assumed pt care at 0700. Pt resting comfortably in bed in no acute distress. A&O. Fiance at bedside. Denies pain. Tolerating diet. Pt moves all extremities equal. Pt feeding himself w/o difficulties. CMS intact. Pt reverts back and forth from a monotone, flat speech pattern to a normal speech pattern when staff is in the room. Right AC PIV intact, w/o complications. Call bell within reach. Will continue to monitor.

## 2021-04-29 NOTE — TOC Transition Note (Addendum)
Transition of Care [facility]) - CM/SW Discharge Note   Patient Details  Name: Nathaniel Guerrero MRN: [id number] Date of Birth: 09-18-1991  Transition of Care [facility]) CM/SW Contact:  Barry Brunner, LCSW Phone Number: 04/29/2021, 11:19 AM   Clinical Narrative:    CSW notified of patient's readiness for discharge. CSW referred patient to [place] with [facility] and [place] with [employer]. Denyse Amass agreeable to take patient. Jasmine with [employer] agreeable to provide DME. CSW provided [place] and [place] with updated [place] [employer] and address provided by spouse. Medicaid # [id number] N and [address] [place]. TOC signing off.  Addendum Jasmine reported Medicaid number provided did not pull up for patient. Patient agreeable to provide CC number and or copy of medicaid card for payment. Jasmine agreeable to have DME equipment drop shipped.   Final next level of care: Home w Home Health Services Barriers to Discharge: Barriers Resolved   Patient Goals and CMS Choice Patient states their goals for this hospitalization and ongoing recovery are:: Return home with [facility] CMS Medicare.gov Compare Post Acute Care list provided to:: Patient Choice offered to / list presented to : Patient  Discharge Placement                    Patient and family notified of of transfer: 04/29/21  Discharge Plan and Services                DME Arranged: Dan Humphreys rolling, 3-N-1 DME Agency: [employer] Date DME Agency Contacted: 04/29/21 Time DME Agency Contacted: [phone] Representative spoke with at DME Agency: Leavy Cella HH Arranged: PT, OT HH Agency: [facility] Date [facility] Agency Contacted: 04/29/21 Time HH Agency Contacted: [phone] Representative spoke with at [facility] Agency: Denyse Amass  Social Determinants of Health (SDOH) Interventions     Readmission Risk Interventions No flowsheet data found.
